# Patient Record
Sex: Female | Born: 1945 | Race: White | Hispanic: No | Marital: Married | Smoking: Never smoker
Health system: Southern US, Community
[De-identification: ages and names within clinical notes are randomized; demographics above are authoritative.]

## PROBLEM LIST (undated history)

## (undated) DIAGNOSIS — I10 Essential (primary) hypertension: Secondary | ICD-10-CM

## (undated) DIAGNOSIS — E7800 Pure hypercholesterolemia, unspecified: Secondary | ICD-10-CM

## (undated) DIAGNOSIS — E079 Disorder of thyroid, unspecified: Secondary | ICD-10-CM

## (undated) HISTORY — PX: CATARACT EXTRACTION: SUR2

## (undated) HISTORY — PX: BREAST EXCISIONAL BIOPSY: SUR124

---

## 2001-05-06 ENCOUNTER — Ambulatory Visit (HOSPITAL_COMMUNITY): Admission: RE | Admit: 2001-05-06 | Discharge: 2001-05-06 | Payer: Self-pay | Admitting: Gastroenterology

## 2001-11-14 ENCOUNTER — Encounter: Admission: RE | Admit: 2001-11-14 | Discharge: 2001-11-14 | Payer: Self-pay | Admitting: General Surgery

## 2001-11-14 ENCOUNTER — Encounter: Payer: Self-pay | Admitting: General Surgery

## 2002-04-22 ENCOUNTER — Encounter: Admission: RE | Admit: 2002-04-22 | Discharge: 2002-04-22 | Payer: Self-pay | Admitting: General Surgery

## 2002-04-22 ENCOUNTER — Encounter: Payer: Self-pay | Admitting: General Surgery

## 2002-11-05 ENCOUNTER — Encounter: Payer: Self-pay | Admitting: Internal Medicine

## 2002-11-05 ENCOUNTER — Encounter: Admission: RE | Admit: 2002-11-05 | Discharge: 2002-11-05 | Payer: Self-pay | Admitting: Internal Medicine

## 2003-04-26 ENCOUNTER — Encounter: Admission: RE | Admit: 2003-04-26 | Discharge: 2003-04-26 | Payer: Self-pay | Admitting: General Surgery

## 2004-05-17 ENCOUNTER — Encounter: Admission: RE | Admit: 2004-05-17 | Discharge: 2004-05-17 | Payer: Self-pay | Admitting: General Surgery

## 2005-08-09 ENCOUNTER — Encounter: Admission: RE | Admit: 2005-08-09 | Discharge: 2005-08-09 | Payer: Self-pay | Admitting: General Surgery

## 2005-10-23 ENCOUNTER — Ambulatory Visit (HOSPITAL_BASED_OUTPATIENT_CLINIC_OR_DEPARTMENT_OTHER): Admission: RE | Admit: 2005-10-23 | Discharge: 2005-10-23 | Payer: Self-pay | Admitting: Orthopedic Surgery

## 2006-09-13 ENCOUNTER — Encounter: Admission: RE | Admit: 2006-09-13 | Discharge: 2006-09-13 | Payer: Self-pay | Admitting: Internal Medicine

## 2007-09-22 ENCOUNTER — Encounter: Admission: RE | Admit: 2007-09-22 | Discharge: 2007-09-22 | Payer: Self-pay | Admitting: Internal Medicine

## 2007-11-29 ENCOUNTER — Emergency Department (HOSPITAL_COMMUNITY): Admission: EM | Admit: 2007-11-29 | Discharge: 2007-11-29 | Payer: Self-pay | Admitting: Emergency Medicine

## 2008-02-20 ENCOUNTER — Ambulatory Visit (HOSPITAL_BASED_OUTPATIENT_CLINIC_OR_DEPARTMENT_OTHER): Admission: RE | Admit: 2008-02-20 | Discharge: 2008-02-20 | Payer: Self-pay | Admitting: Orthopedic Surgery

## 2008-08-17 ENCOUNTER — Encounter: Admission: RE | Admit: 2008-08-17 | Discharge: 2008-08-17 | Payer: Self-pay | Admitting: Internal Medicine

## 2008-11-04 ENCOUNTER — Encounter: Admission: RE | Admit: 2008-11-04 | Discharge: 2008-11-04 | Payer: Self-pay | Admitting: Internal Medicine

## 2008-11-10 ENCOUNTER — Encounter: Admission: RE | Admit: 2008-11-10 | Discharge: 2008-11-10 | Payer: Self-pay | Admitting: Internal Medicine

## 2009-11-11 ENCOUNTER — Encounter: Admission: RE | Admit: 2009-11-11 | Discharge: 2009-11-11 | Payer: Self-pay | Admitting: Internal Medicine

## 2010-04-11 ENCOUNTER — Encounter: Payer: Self-pay | Admitting: Family Medicine

## 2010-05-02 ENCOUNTER — Ambulatory Visit: Payer: Self-pay | Admitting: Sports Medicine

## 2010-05-02 DIAGNOSIS — M7750 Other enthesopathy of unspecified foot: Secondary | ICD-10-CM | POA: Insufficient documentation

## 2010-05-02 DIAGNOSIS — M25569 Pain in unspecified knee: Secondary | ICD-10-CM

## 2010-05-02 DIAGNOSIS — M79609 Pain in unspecified limb: Secondary | ICD-10-CM | POA: Insufficient documentation

## 2010-05-05 ENCOUNTER — Encounter: Payer: Self-pay | Admitting: Family Medicine

## 2010-07-01 ENCOUNTER — Encounter: Payer: Self-pay | Admitting: General Surgery

## 2010-07-03 ENCOUNTER — Encounter: Payer: Self-pay | Admitting: Internal Medicine

## 2010-07-13 NOTE — Letter (Signed)
Summary: *Consult Note  [facility]  [address]   [place], [place] [address]   Phone: [phone]  Fax: [phone]    Re:    Valerie Obrien DOB:    10-Mar-1946   Dear Dr. Thurston Hole:    Thank you for requesting that we see the above patient for consultation.  A copy of the detailed office note will be sent under separate cover, for your review.  Evaluation today is consistent with:  1)  FOOT PAIN, BILATERAL (ICD-729.5) 2)  METATARSALGIA (ICD-726.70) 3)  KNEE PAIN (ICD-719.46)  Our recommendation is for:  [name] was fitted with Sports Insoles with metatarsal pads on both.  These were comfortable to her and she felt improvement in pain while in office.  Recommended good supportive shoes.  Encouraged to start Mobic to help feet and her left knee.  Follow-up with [place] in 3-4 weeks, would be candidate for custom orthotics at that time if sports insoles helpful.  She should follow-up with you as scheduled.   New Orders include:  1)  Consultation Level II [99242] 2)  Foot Orthosis ( Arch Strap/Heel Cup) [L2440] 3)  Sports Insoles [L3510]   New Medications started today include:  1) None  After today's visit, the patients current medications include: 1)  CELEXA 10 MG TABS (CITALOPRAM HYDROBROMIDE) take 1/2 tablet by mouth every other Chalene Treu 2)  SYNTHROID 88 MCG TABS (LEVOTHYROXINE SODIUM) take 1 tablet by mouth once daily 3)  CALTRATE 600+D PLUS 600-400 MG-UNIT TABS (CALCIUM CARBONATE-VIT D-MIN) take 1 tablet by mouth once daily 4)  VOLTAREN 1 % GEL (DICLOFENAC SODIUM) Apply to knee two times a Inetta Dicke prn   Thank you for this consultation.  If you have any further questions regarding the care of this patient, please do not hesitate to contact me @ [phone].  Thank you for this opportunity to look after your patient.  Sincerely,   Darene Lamer DO [facility]

## 2010-07-13 NOTE — Assessment & Plan Note (Signed)
Summary: NP,ORTHOTICS PER [facility]   Vital Signs:  Patient profile:   65 year old female Height:      63 inches Weight:      136 pounds BMI:     24.18 Pulse rate:   76 / minute BP sitting:   125 / 84  (left arm)  Vitals Entered By: Rochele Pages RN (May 02, 2010 3:17 PM) CC: bilat foot pain- ? orthotics   CC:  bilat foot pain- ? orthotics.  History of Present Illness: 65yo female referred to office by Dr. Thurston Hole for possible orthotics evaluation. She has had b/l foot pain for several years, but worse over past several months. Foot pain is primarily on bottom of Rt foot along MT heads.   She has occasional swelling, but denies any erythema or bruising. Denies any injury or trauma.   Has occasional shooting pain radiating into Rt 3rd & 4th toes.  Denies any numbness/tingling. Also has bunions on both feet, so tries to wear athletic shoes with wide toe box.  Athletic shoes are also most comfortable to her. She has increased pain in the foot with walking barefoot at home. Has nevere had orthotics or worn shoe inserts. Prescribed Mobic by Dr. Thurston Hole, but she has not yet filled.  Also c/o L knee pain x 29month.  Had fall when pain started, falling directly onto flexed knee. Since that time having some swelling over anterior aspect of knee. Denies any mechanical symptoms or instability. Prescribed Mobic by Dr. Thurston Hole, but has not yet filled.  Preventive Screening-Counseling & Management  Alcohol-Tobacco     Smoking Status: never  Allergies (verified): 1)  ! Sulfa  Past History:  Past Medical History: Hypothyroid HTN Depression  Past Surgical History: Denies surgical history  Family History: Mom with DM, CAD, HTN  Social History: Retired. Denies tobacco or alcohol useSmoking Status:  never  Review of Systems General:  Denies chills, fatigue, fever, sleep disorder, sweats, and weakness. Eyes:  Denies double vision, eye pain, light sensitivity, and red eye. ENT:   Denies difficulty swallowing, nasal congestion, and sore throat. CV:  Denies chest pain or discomfort, leg cramps with exertion, shortness of breath with exertion, swelling of feet, and swelling of hands. Resp:  Denies chest discomfort, cough, and shortness of breath. GI:  Denies abdominal pain, bloody stools, dark tarry stools, diarrhea, nausea, vomiting, and vomiting blood. MS:  Complains of joint pain and stiffness; denies joint redness, joint swelling, loss of strength, low back pain, mid back pain, muscle aches, muscle, cramps, muscle weakness, and thoracic pain. Derm:  Denies changes in color of skin, changes in nail beds, flushing, itching, lesion(s), and rash. Neuro:  Denies falling down, numbness, poor balance, tingling, tremors, and weakness. Psych:  Denies depression. Endo:  Hx hypothyroid. Heme:  Denies abnormal bruising, bleeding, fevers, and skin discoloration. Allergy:  Denies hives or rash, itching eyes, and sneezing.  Physical Exam  General:  Well-developed,well-nourished,in no acute distress; alert,appropriate and cooperative throughout examination Head:  [place]/AT Eyes:  PERLLA, EOMI Lungs:  normal respiratory effort.   Msk:  KNEES: Full ROM b/l without pain.  Mild amount of soft tissue swelling anteriorly over pre-patellar bursa on left.  TTP over L pre-patellar bursa, otherwise no other knee tenderness.  No ligamentous laxity, neg McMurray bilaterally.  FEET: Mild hallux valgus b/l - L>R.  Longitudinal arches preserved b/l.  Collapse of transverse arches b/l - more prominent on Rt.  TTP along 2nd-4th MT heads on plantar surface of Rt foot,  no associated swelling, bruising, erythema.  Mild pain with MT squeeze.  No significant pain along PF.  Mild pain in 3rd webspace.  Lt foot with mild TTP along MT heads on plantar aspect of foot.  Mild tenderness along PF.  GAIT: Normal heel-to-toe gait.  Lt foot slightly externally rotated with walking.  Pelvis level, no leg length  difference.   Pulses:  +2/4 DP & PT b/l Extremities:  no edema Neurologic:  sensation intact to light touch.     Impression & Recommendations:  Problem # 1:  FOOT PAIN, BILATERAL (ICD-729.5)  - bilateral foot pain secondary to metatarsalgia.  Has significant collapse of transverse arch on Rt with pain along 2nd-4th MT heads. - Fitted with Sports Insoles with MT pads on both sides.  These were comfortable to pt in office & felt some improvement in both foot & knee pain.  Cont to wear these in all of her shoes.  Given additional MT pads to fit in dress shoes that Sports Insoles won't fit into. - Encouraged to wear athletic shoes with good support. - Mild bunion b/l - encouraged to wear shoes with wide toebox for comfort. - Return to clinic in 3-4 weeks, consider custom orthotics at that time.  Orders: Foot Orthosis ( Arch Strap/Heel Cup) [phone]) Sports Insoles [phone])  Problem # 2:  METATARSALGIA (ICD-726.70)  - Bilateral metatarsalgia, most prominent on Rt along 2nd-4th MT heads, but mild on Lt foot. - Fitted with Sports Insoles & MT pads as stated above - Would be candidate for custom orthotics in future if responds well to sports insoles.  Orders: Foot Orthosis ( Arch Strap/Heel Cup) [phone]) Sports Insoles [phone])  Problem # 3:  KNEE PAIN (ICD-719.46)  - L pre-patellar bursitis which is improving - Cont. to follow with Dr. Thurston Hole as scheduled - Recommend she start Mobic as prescribed by Dr. Thurston Hole - Cont. Voltaren gel as prescribed by Dr. Thurston Hole  Orders: Foot Orthosis ( Arch Strap/Heel Cup) [phone]) Sports Insoles [phone])  Complete Medication List: 1)  Celexa 10 Mg Tabs (Citalopram hydrobromide) .... Take 1/2 tablet by mouth every other day 2)  Synthroid 88 Mcg Tabs (Levothyroxine sodium) .... Take 1 tablet by mouth once daily 3)  Caltrate 600+d Plus 600-400 Mg-unit Tabs (Calcium carbonate-vit d-min) .... Take 1 tablet by mouth once daily 4)  Voltaren 1 % Gel (Diclofenac  sodium) .... Apply to knee two times a day prn   Orders Added: 1)  Consultation Level II [99242] 2)  Foot Orthosis ( Arch Strap/Heel Cup) [V7846] 3)  Sports Insoles [L3510]

## 2010-07-13 NOTE — Consult Note (Signed)
Summary: [facility]  [facility]   Imported By: Marily Memos 04/13/2010 09:11:40  _____________________________________________________________________  External Attachment:    Type:   Image     Comment:   External Document

## 2010-08-21 ENCOUNTER — Other Ambulatory Visit: Payer: Self-pay | Admitting: Orthopedic Surgery

## 2010-08-21 ENCOUNTER — Ambulatory Visit
Admission: RE | Admit: 2010-08-21 | Discharge: 2010-08-21 | Disposition: A | Discharge status: Home / Self Care | Payer: BC Managed Care – PPO | Source: Ambulatory Visit | Attending: Orthopedic Surgery | Admitting: Orthopedic Surgery

## 2010-08-21 DIAGNOSIS — M25519 Pain in unspecified shoulder: Secondary | ICD-10-CM

## 2010-10-06 ENCOUNTER — Other Ambulatory Visit: Payer: Self-pay | Admitting: Internal Medicine

## 2010-10-06 DIAGNOSIS — Z1231 Encounter for screening mammogram for malignant neoplasm of breast: Secondary | ICD-10-CM

## 2010-10-10 ENCOUNTER — Other Ambulatory Visit: Payer: Self-pay | Admitting: Orthopedic Surgery

## 2010-10-16 ENCOUNTER — Other Ambulatory Visit: Payer: BC Managed Care – PPO

## 2010-10-24 NOTE — Op Note (Signed)
Valerie Obrien, Valerie Obrien                 ACCOUNT NO.:  [id number]   MEDICAL RECORD NO.:  [id number]          PATIENT TYPE:  AMB   LOCATION:  DSC                          FACILITY:  [facility]   PHYSICIAN:  Katy Fitch. Sypher, M.D. 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   POSTOPERATIVE DIAGNOSIS:  Entrapment neuropathy median nerve right  carpal tunnel.   OPERATIONS:  Release of right transverse carpal ligament.   SURGEON:  Katy Fitch. Sypher, MD   ASSISTANT:  Marveen Reeks Dasnoit, PA-C   ANESTHESIA:  General by LMA.   SUPERVISING ANESTHESIOLOGIST:  Janetta Hora. Gelene Mink, MD   INDICATIONS:  [name] is a 65 year old woman employed by the  [employer] who was referred by Dr. Timothy Lasso for  evaluation and management of right hand numbness.  Clinical examination  revealed signs of probable carpal tunnel syndrome.  Electrodiagnostic  studies confirmed significant right carpal tunnel syndrome.   Due to a failure to respond to nonoperative measures, she is brought to  the operating room at this time for release of right transverse carpal  ligament.   PROCEDURE:  Vastie Douty. Affinito is brought to the operating room and placed  in supine position upon the operating table.   Following the induction of general anesthesia by LMA technique, the  right arm was prepped with Betadine soap and solution and sterilely  draped.  A pneumatic tourniquet was applied to the proximal right  brachium.   On exsanguination of the right arm with an Esmarch bandage, the arterial  tourniquet was inflated to 220 mmHg.  Procedure commenced with a short  incision in the line of the ring finger and the palm.  Subcutaneous  tissues were carefully divided revealing the palmar fascia.  This was  split in line of its fibers to reveal the  superficial palmar arch, ulnar  artery and the distal margin of the transverse carpal ligament.   The carpal canal was sounded with a Insurance risk surveyor.  After careful  protection of the soft tissue structures, the transverse carpal ligament  was released along its ulnar border extending into the distal forearm.  This widely opened the carpal canal.  No mass or predicaments were  noted.   Bleeding points along the margin of the released ligament were  electrocauterized with bipolar current followed by repair of the skin  with intradermal 3-0 Prolene suture.   A compressive dressing was applied with a volar plaster splint  maintaining the wrist in 5 degrees of dorsiflexion.   For aftercare, Ms. Killilea is provided prescription for Tylenol with  Codeine #3 one p.o. q. 6 hours p.r.n. pain 20 tablets without refill.   She will return to see me in the office for followup in 1 week or sooner  if any problems.      Katy Fitch Sypher, M.D.  Electronically Signed     RVS/MEDQ  D:  02/20/2008  T:  02/20/2008  Job:  [id number]   cc:   Gwen Pounds, MD

## 2010-10-27 NOTE — Op Note (Signed)
Valerie Obrien, Valerie Obrien                 ACCOUNT NO.:  [id number]   MEDICAL RECORD NO.:  [id number]          PATIENT TYPE:  AMB   LOCATION:  DSC                          FACILITY:  [facility]   PHYSICIAN:  Katy Fitch. Sypher, M.D. DATE OF BIRTH:  Sep 17, 1945   DATE OF PROCEDURE:  10/23/2005  DATE OF DISCHARGE:                                 OPERATIVE REPORT   PREOP DIAGNOSIS:  Retained foreign body, right ring finger pulp.   POSTOP DIAGNOSIS:  Ziebarth foreign body (7 mm Gloeckner splinter) identified and  removed.   OPERATION:  Exploration of right ring finger pulp and removal of deep  foreign body.   OPERATING SURGEON:  Molly Maduro Sypher   ASSISTANT:  Molly Maduro Dasnoit PA-C.   ANESTHESIA:  2% lidocaine metacarpal head level block, right ring finger.  No sedation was provided.  This was a minor operating room case.   ANESTHETIST:  Dr. Teressa Senter.   INDICATIONS:  Valerie Obrien is a 65 year old woman referred for evaluation of  a chronic right ring finger pulp mass.   She reported experiencing a splinter type injury more than 3 months prior.  She thought she recovered part of the splinter but had a persistent painful  focus in her palm and a chronic sinus tract noted.   She presented for hand surgery consult.   She is advised to consider exploration of her finger under local anesthesia  without sedation.   She is brought to the minor operating room at this time.   PROCEDURE:  Valerie Obrien was brought to the operating room and placed in  supine position on operating table.   Following routine Betadine prep of the hand and finger, 2% lidocaine  metacarpal head level block was placed without complication.   When anesthesia was satisfactory, the right hand and arm were prepped with  Betadine soap solution, sterilely draped.  The right ring finger was  exsanguinated with a gauze wrap and a digital tourniquet placed with quarter  inch Penrose drain.   The procedure commenced with an elliptical excision of  the sinus tract..  Subcutaneous tissues were carefully divided revealing a fibrotic sinus tract  that extended down to the flexor tendon region.   This was circumferentially dissected followed by identification of Rollison  foreign body.  The foreign body was removed and shown to [name] who was  fully awake and alert.   The wound was then inspected for other masses, none were identified.   The wound was repaired with mattress suture of 5-0 nylon x2.   There no apparent complications.   The finger was then dressed with Xeroflo sterile gauze and a Coban dressing.   For aftercare [name] is advised elevator hand for 2 days.   She will keep her dressing dry between now and 10/27/05.   On that date she will remove her dressing and apply Band-Aids.   We will see her back for follow up on 10/31/2005.   She will use over-the-counter analgesics.   She has not been provided prophylactic antibiotics.      Katy Fitch Sypher, M.D.  Electronically Signed     RVS/MEDQ  D:  10/23/2005  T:  10/23/2005  Job:  [id number]

## 2010-11-14 ENCOUNTER — Ambulatory Visit: Payer: BC Managed Care – PPO

## 2010-11-15 ENCOUNTER — Ambulatory Visit
Admission: RE | Admit: 2010-11-15 | Discharge: 2010-11-15 | Disposition: A | Discharge status: Home / Self Care | Payer: BC Managed Care – PPO | Source: Ambulatory Visit | Attending: Internal Medicine | Admitting: Internal Medicine

## 2010-11-15 DIAGNOSIS — Z1231 Encounter for screening mammogram for malignant neoplasm of breast: Secondary | ICD-10-CM

## 2011-03-14 LAB — POCT HEMOGLOBIN-HEMACUE: Hemoglobin: 16.4 — ABNORMAL HIGH

## 2011-11-07 ENCOUNTER — Other Ambulatory Visit: Payer: Self-pay | Admitting: Internal Medicine

## 2011-11-07 DIAGNOSIS — Z1231 Encounter for screening mammogram for malignant neoplasm of breast: Secondary | ICD-10-CM

## 2011-11-26 ENCOUNTER — Ambulatory Visit
Admission: RE | Admit: 2011-11-26 | Discharge: 2011-11-26 | Disposition: A | Discharge status: Home / Self Care | Payer: BC Managed Care – PPO | Source: Ambulatory Visit | Attending: Internal Medicine | Admitting: Internal Medicine

## 2011-11-26 DIAGNOSIS — Z1231 Encounter for screening mammogram for malignant neoplasm of breast: Secondary | ICD-10-CM

## 2012-10-20 ENCOUNTER — Other Ambulatory Visit: Payer: Self-pay

## 2012-10-20 DIAGNOSIS — Z1231 Encounter for screening mammogram for malignant neoplasm of breast: Secondary | ICD-10-CM

## 2012-11-26 ENCOUNTER — Ambulatory Visit: Payer: BC Managed Care – PPO

## 2012-12-03 ENCOUNTER — Ambulatory Visit
Admission: RE | Admit: 2012-12-03 | Discharge: 2012-12-03 | Disposition: A | Discharge status: Home / Self Care | Payer: BC Managed Care – PPO | Source: Ambulatory Visit

## 2012-12-03 DIAGNOSIS — Z1231 Encounter for screening mammogram for malignant neoplasm of breast: Secondary | ICD-10-CM

## 2013-09-01 ENCOUNTER — Ambulatory Visit: Payer: BC Managed Care – PPO | Admitting: Physical Therapy

## 2013-09-02 ENCOUNTER — Ambulatory Visit: Payer: Medicare Other | Attending: Orthopedic Surgery | Admitting: Physical Therapy

## 2013-09-02 DIAGNOSIS — M7750 Other enthesopathy of unspecified foot: Secondary | ICD-10-CM | POA: Insufficient documentation

## 2013-09-02 DIAGNOSIS — M25569 Pain in unspecified knee: Secondary | ICD-10-CM | POA: Insufficient documentation

## 2013-09-02 DIAGNOSIS — M79609 Pain in unspecified limb: Secondary | ICD-10-CM | POA: Insufficient documentation

## 2013-09-02 DIAGNOSIS — IMO0001 Reserved for inherently not codable concepts without codable children: Secondary | ICD-10-CM | POA: Insufficient documentation

## 2013-09-07 ENCOUNTER — Ambulatory Visit: Payer: Medicare Other | Admitting: Physical Therapy

## 2013-09-09 ENCOUNTER — Ambulatory Visit: Payer: Medicare Other | Attending: Orthopedic Surgery | Admitting: Physical Therapy

## 2013-09-09 DIAGNOSIS — R5381 Other malaise: Secondary | ICD-10-CM | POA: Insufficient documentation

## 2013-09-09 DIAGNOSIS — M25579 Pain in unspecified ankle and joints of unspecified foot: Secondary | ICD-10-CM | POA: Insufficient documentation

## 2013-09-09 DIAGNOSIS — Z5189 Encounter for other specified aftercare: Secondary | ICD-10-CM | POA: Insufficient documentation

## 2013-09-14 ENCOUNTER — Ambulatory Visit: Payer: Medicare Other | Admitting: *Deleted

## 2013-09-16 ENCOUNTER — Encounter: Payer: BC Managed Care – PPO | Admitting: Physical Therapy

## 2013-09-21 ENCOUNTER — Ambulatory Visit: Payer: Medicare Other | Admitting: Physical Therapy

## 2013-09-28 ENCOUNTER — Encounter: Payer: BC Managed Care – PPO | Admitting: Physical Therapy

## 2013-10-26 ENCOUNTER — Other Ambulatory Visit: Payer: Self-pay

## 2013-10-26 DIAGNOSIS — Z1231 Encounter for screening mammogram for malignant neoplasm of breast: Secondary | ICD-10-CM

## 2013-12-04 ENCOUNTER — Other Ambulatory Visit: Payer: Self-pay

## 2013-12-04 ENCOUNTER — Ambulatory Visit
Admission: RE | Admit: 2013-12-04 | Discharge: 2013-12-04 | Disposition: A | Discharge status: Home / Self Care | Payer: Medicare Other | Source: Ambulatory Visit

## 2013-12-04 ENCOUNTER — Encounter (INDEPENDENT_AMBULATORY_CARE_PROVIDER_SITE_OTHER): Payer: Self-pay

## 2013-12-04 DIAGNOSIS — Z1231 Encounter for screening mammogram for malignant neoplasm of breast: Secondary | ICD-10-CM

## 2014-11-15 ENCOUNTER — Other Ambulatory Visit: Payer: Self-pay

## 2014-11-15 DIAGNOSIS — Z1231 Encounter for screening mammogram for malignant neoplasm of breast: Secondary | ICD-10-CM

## 2014-11-16 ENCOUNTER — Ambulatory Visit: Payer: Self-pay | Admitting: Podiatry

## 2014-11-19 ENCOUNTER — Ambulatory Visit (INDEPENDENT_AMBULATORY_CARE_PROVIDER_SITE_OTHER): Payer: Medicare Other | Admitting: Podiatry

## 2014-11-19 ENCOUNTER — Encounter: Payer: Self-pay | Admitting: Podiatry

## 2014-11-19 ENCOUNTER — Ambulatory Visit (INDEPENDENT_AMBULATORY_CARE_PROVIDER_SITE_OTHER): Payer: Medicare Other

## 2014-11-19 VITALS — BP 147/86 | HR 55 | Resp 16

## 2014-11-19 DIAGNOSIS — M2040 Other hammer toe(s) (acquired), unspecified foot: Secondary | ICD-10-CM | POA: Diagnosis not present

## 2014-11-19 DIAGNOSIS — M2010 Hallux valgus (acquired), unspecified foot: Secondary | ICD-10-CM | POA: Diagnosis not present

## 2014-11-19 NOTE — Progress Notes (Signed)
° °  Subjective:    Patient ID: Valerie Obrien, female    DOB: May 18, 1946, 69 y.o.   MRN: [id number]  HPI   N: Nothing L; B/L foot D: Fungus over a year, Hammer toe for a while (if that's what it is) O: Sudden C: nothing A: Nothing T: Keep clipped    Review of Systems  All other systems reviewed and are negative.      Objective:   Physical Exam        Assessment & Plan:

## 2014-11-22 NOTE — Progress Notes (Signed)
Subjective:     Patient ID: Valerie Obrien, female   DOB: December 02, 1945, 69 y.o.   MRN: [id number]  HPI patient presents concerned because she's getting structural changes around her first metatarsal of both feet and that she has digital deformities of the second toes both feet with lifting of the toes and pain when pressed. She states that they're not significantly sore but she was just concerned that they could become more of a problem later on   Review of Systems  All other systems reviewed and are negative.      Objective:   Physical Exam  Constitutional: She is oriented to person, place, and time.  Cardiovascular: Intact distal pulses.   Musculoskeletal: Normal range of motion.  Neurological: She is oriented to person, place, and time.  Skin: Skin is warm and dry.  Nursing note and vitals reviewed.  neurovascular status is intact with muscle strength adequate and range of motion subtalar midtarsal joint within normal limits. Patient's noted to have hyperostosis medial aspect first metatarsal head bilateral with redness around the surfaces but no discomfort when pressed and mild elevation the second toe bilateral. 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 At this point I have recommended she continue nonsurgical and utilize wider-type shoes with soft leather and if they ever do increase in symptoms ultimately they may need to be fixed

## 2014-12-10 ENCOUNTER — Ambulatory Visit: Payer: Self-pay

## 2014-12-27 ENCOUNTER — Ambulatory Visit
Admission: RE | Admit: 2014-12-27 | Discharge: 2014-12-27 | Disposition: A | Discharge status: Home / Self Care | Payer: Medicare Other | Source: Ambulatory Visit

## 2014-12-27 DIAGNOSIS — Z1231 Encounter for screening mammogram for malignant neoplasm of breast: Secondary | ICD-10-CM

## 2015-02-16 ENCOUNTER — Telehealth: Payer: Self-pay | Admitting: *Deleted

## 2015-02-16 ENCOUNTER — Telehealth: Payer: Self-pay | Admitting: Podiatry

## 2015-02-16 NOTE — Telephone Encounter (Signed)
I encouraged pt to make an appt, since she said it was very painful in the corner and had calloused skin at the tip, I told pt to use Epsom salt soaks and antibiotic ointment until seen and transferred pt to schedulers.

## 2015-02-16 NOTE — Telephone Encounter (Signed)
Entered in error

## 2015-02-16 NOTE — Telephone Encounter (Signed)
Pt called in stating she cut her toenail too short , she doesn't have any bleeding , no puss , but the next day it was very sore and red. She said she soaked it in salt water and has been putting peroxide on it and also using Red Oil on it also. She is not a diabetic. She also says when she puts a band aid on it its gives her discomfort and it irrates her toe. Wanting too know what she should do about this. Should she keep doing what she is doing or should she come in , che says she doesn't want it too get infected. If you could give her a call back or inform me on what too instruct her and i was give her a call with the information .     Thank You !!  Fredrich Birks

## 2015-02-17 ENCOUNTER — Encounter: Payer: Self-pay | Admitting: Podiatry

## 2015-02-17 ENCOUNTER — Ambulatory Visit (INDEPENDENT_AMBULATORY_CARE_PROVIDER_SITE_OTHER): Payer: Medicare Other | Admitting: Podiatry

## 2015-02-17 VITALS — BP 128/77 | HR 74 | Resp 16

## 2015-02-17 DIAGNOSIS — L600 Ingrowing nail: Secondary | ICD-10-CM

## 2015-02-17 NOTE — Patient Instructions (Signed)

## 2015-02-18 ENCOUNTER — Telehealth: Payer: Self-pay | Admitting: *Deleted

## 2015-02-18 NOTE — Progress Notes (Signed)
Subjective:     Patient ID: Valerie Obrien, female   DOB: September 18, 1945, 69 y.o.   MRN: [id number]  HPI patient presents with painful ingrown toenails of the second nail E deal border bilateral and admits she tries to trim them herself and at times she can do the corners   Review of Systems     Objective:   Physical Exam Neurovascular status found to be intact muscle strength adequate with incurvated nailbed second bilateral medial border that are painful when pressed with no indications of current infection or drainage    Assessment:     Ingrown toenail deformity second bilateral    Plan:     H&P and condition reviewed with patient. I've recommended removing the corners and I explained risk of procedure and she wants this performed area at this time I infiltrated each second toe with 60 mg Xylocaine Marcaine mixture removed the medial corners exposed matrix and applied phenol 3 applications 30 seconds followed by alcohol lavage and sterile dressing. Gave instructions on soaks and reappoint

## 2015-02-18 NOTE — Telephone Encounter (Signed)
Called patient at [phone] (Cell #) to check to see how they were feeling from their ingrown toenail procedure that was performed on Thursday, February 17, 2015. Pt stated, "Feel okay, but had some pain last night between 10 and 10:30 pm". Pt took Advil with some relief. Pt also stated, "there was bruising where the injection went into".

## 2015-12-19 ENCOUNTER — Other Ambulatory Visit: Payer: Self-pay | Admitting: Internal Medicine

## 2015-12-19 DIAGNOSIS — Z1231 Encounter for screening mammogram for malignant neoplasm of breast: Secondary | ICD-10-CM

## 2015-12-30 ENCOUNTER — Ambulatory Visit
Admission: RE | Admit: 2015-12-30 | Discharge: 2015-12-30 | Disposition: A | Discharge status: Home / Self Care | Payer: Medicare Other | Source: Ambulatory Visit | Attending: Internal Medicine | Admitting: Internal Medicine

## 2015-12-30 DIAGNOSIS — Z1231 Encounter for screening mammogram for malignant neoplasm of breast: Secondary | ICD-10-CM

## 2016-01-03 ENCOUNTER — Telehealth: Payer: Self-pay | Admitting: Cardiology

## 2016-01-03 NOTE — Telephone Encounter (Signed)
Records received from [facility] for appointment on 04/30/16 with Dr Martinique.  Records given to [facility] (medical records) for Dr Doug Sou schedule on 04/30/16. lp

## 2016-04-30 ENCOUNTER — Ambulatory Visit: Payer: Medicare Other | Admitting: Cardiology

## 2016-06-09 NOTE — Progress Notes (Deleted)
Cardiology Office Note    Date:  06/09/2016   ID:  Valerie Obrien, Valerie Obrien 1945-08-23, MRN [id number]  PCP:  Precious Reel, MD  Cardiologist:  Deshane Cotroneo Martinique, MD    History of Present Illness:  Valerie Obrien is a 70 y.o. female seen at the request of Dr. Virgina Jock for evaluation of palpitations.    No past medical history on file.  No past surgical history on file.  Current Medications: Outpatient Medications Prior to Visit  Medication Sig Dispense Refill  . Calcium Carbonate-Vitamin D (CALTRATE 600+D PO) Take 2 tablets by mouth daily.    . citalopram (CELEXA) 20 MG tablet daily. as directed  5  . diclofenac sodium (VOLTAREN) 1 % GEL APPLY 2-4 GRAM TO AFFECTED AREA 4 TIMES DAILY AS NEEDED  1  . DIOVAN 80 MG tablet TAKE 1 TABLET EVERY DAY AS DIRECTED  5  . omeprazole (PRILOSEC) 10 MG capsule Take 10 mg by mouth daily.    Marland Kitchen SYNTHROID 88 MCG tablet Take 88 mcg by mouth daily.  9   No facility-administered medications prior to visit.      Allergies:   Sulfonamide derivatives   Social History   Social History  . Marital status: Married    Spouse name: N/A  . Number of children: N/A  . Years of education: N/A   Social History Main Topics  . Smoking status: Never Smoker  . Smokeless tobacco: Not on file  . Alcohol use Not on file  . Drug use: Unknown  . Sexual activity: Not on file   Other Topics Concern  . Not on file   Social History Narrative  . No narrative on file     Family History:  The patient's ***family history is not on file.   ROS:   Please see the history of present illness.    ROS All other systems reviewed and are negative.   PHYSICAL EXAM:   VS:  There were no vitals taken for this visit.   GEN: Well nourished, well developed, in no acute distress  HEENT: normal  Neck: no JVD, carotid bruits, or masses Cardiac: ***RRR; no murmurs, rubs, or gallops,no edema  Respiratory:  clear to auscultation bilaterally, normal work of breathing GI: soft,  nontender, nondistended, + BS MS: no deformity or atrophy  Skin: warm and dry, no rash Neuro:  Alert and Oriented x 3, Strength and sensation are intact Psych: euthymic mood, full affect  Wt Readings from Last 3 Encounters:  05/02/10 136 lb (61.7 kg)      Studies/Labs Reviewed:   EKG:  EKG is*** ordered today.  The ekg ordered today demonstrates ***  Recent Labs: No results found for requested labs within last 8760 hours.   Lipid Panel No results found for: CHOL, TRIG, HDL, CHOLHDL, VLDL, LDLCALC, LDLDIRECT  Additional studies/ records that were reviewed today include:  Labs dated 11/08/15: cholesterol 174, triglycerides 116, LDL 203, HDL 48. CBC normal Dated 05/11/16: A1c 5.9%. CMET and TSH normal.   ASSESSMENT:    No diagnosis found.   PLAN:  In order of problems listed above:  1. ***    Medication Adjustments/Labs and Tests Ordered: Current medicines are reviewed at length with the patient today.  Concerns regarding medicines are outlined above.  Medication changes, Labs and Tests ordered today are listed in the Patient Instructions below. There are no Patient Instructions on file for this visit.   Signed, Garen Woolbright Martinique, MD  06/09/2016 8:55 AM  [facility] [address], [place], [place], [address] [phone]

## 2016-06-13 ENCOUNTER — Ambulatory Visit: Payer: Medicare Other | Admitting: Cardiology

## 2017-01-24 ENCOUNTER — Other Ambulatory Visit: Payer: Self-pay | Admitting: Internal Medicine

## 2017-01-24 DIAGNOSIS — Z1231 Encounter for screening mammogram for malignant neoplasm of breast: Secondary | ICD-10-CM

## 2017-02-07 ENCOUNTER — Ambulatory Visit
Admission: RE | Admit: 2017-02-07 | Discharge: 2017-02-07 | Disposition: A | Discharge status: Home / Self Care | Payer: Medicare Other | Source: Ambulatory Visit | Attending: Internal Medicine | Admitting: Internal Medicine

## 2017-02-07 ENCOUNTER — Encounter: Payer: Self-pay | Admitting: Radiology

## 2017-02-07 DIAGNOSIS — Z1231 Encounter for screening mammogram for malignant neoplasm of breast: Secondary | ICD-10-CM

## 2017-12-16 ENCOUNTER — Ambulatory Visit (HOSPITAL_COMMUNITY)
Admission: RE | Admit: 2017-12-16 | Discharge: 2017-12-16 | Disposition: A | Discharge status: Home / Self Care | Payer: Medicare Other | Source: Ambulatory Visit | Attending: Orthopedic Surgery | Admitting: Orthopedic Surgery

## 2017-12-16 ENCOUNTER — Other Ambulatory Visit (HOSPITAL_COMMUNITY): Payer: Self-pay | Admitting: Orthopedic Surgery

## 2017-12-16 DIAGNOSIS — M79605 Pain in left leg: Secondary | ICD-10-CM

## 2017-12-16 DIAGNOSIS — R936 Abnormal findings on diagnostic imaging of limbs: Secondary | ICD-10-CM | POA: Insufficient documentation

## 2017-12-16 DIAGNOSIS — M7989 Other specified soft tissue disorders: Secondary | ICD-10-CM | POA: Diagnosis not present

## 2017-12-16 NOTE — Progress Notes (Signed)
*  PRELIMINARY RESULTS* Vascular Ultrasound Left lower extremity venous duplex has been completed.  Preliminary findings: No evidence of deep vein thrombosis in the left lower extremity.  Small fluid collection seen posterior knee.  Preliminary results called to Dr. Elsie Saas @ 16:30.   Everrett Coombe 12/16/2017, 4:50 PM

## 2018-01-17 ENCOUNTER — Other Ambulatory Visit: Payer: Self-pay | Admitting: Internal Medicine

## 2018-01-17 DIAGNOSIS — Z1231 Encounter for screening mammogram for malignant neoplasm of breast: Secondary | ICD-10-CM

## 2018-02-11 ENCOUNTER — Ambulatory Visit
Admission: RE | Admit: 2018-02-11 | Discharge: 2018-02-11 | Disposition: A | Discharge status: Home / Self Care | Payer: Medicare Other | Source: Ambulatory Visit | Attending: Internal Medicine | Admitting: Internal Medicine

## 2018-02-11 DIAGNOSIS — Z1231 Encounter for screening mammogram for malignant neoplasm of breast: Secondary | ICD-10-CM

## 2018-10-01 ENCOUNTER — Other Ambulatory Visit: Payer: Self-pay

## 2018-10-01 ENCOUNTER — Observation Stay (HOSPITAL_COMMUNITY)
Admission: EM | Admit: 2018-10-01 | Discharge: 2018-10-03 | Disposition: A | Discharge status: Home / Self Care | Payer: Medicare Other | Attending: Internal Medicine | Admitting: Internal Medicine

## 2018-10-01 ENCOUNTER — Emergency Department (HOSPITAL_COMMUNITY): Payer: Medicare Other

## 2018-10-01 ENCOUNTER — Encounter (HOSPITAL_COMMUNITY): Payer: Self-pay | Admitting: Emergency Medicine

## 2018-10-01 DIAGNOSIS — R109 Unspecified abdominal pain: Secondary | ICD-10-CM

## 2018-10-01 DIAGNOSIS — Z880 Allergy status to penicillin: Secondary | ICD-10-CM | POA: Diagnosis not present

## 2018-10-01 DIAGNOSIS — E7800 Pure hypercholesterolemia, unspecified: Secondary | ICD-10-CM | POA: Insufficient documentation

## 2018-10-01 DIAGNOSIS — R7989 Other specified abnormal findings of blood chemistry: Secondary | ICD-10-CM | POA: Insufficient documentation

## 2018-10-01 DIAGNOSIS — E876 Hypokalemia: Secondary | ICD-10-CM | POA: Insufficient documentation

## 2018-10-01 DIAGNOSIS — Z79899 Other long term (current) drug therapy: Secondary | ICD-10-CM | POA: Insufficient documentation

## 2018-10-01 DIAGNOSIS — E785 Hyperlipidemia, unspecified: Secondary | ICD-10-CM | POA: Diagnosis not present

## 2018-10-01 DIAGNOSIS — E039 Hypothyroidism, unspecified: Secondary | ICD-10-CM | POA: Diagnosis not present

## 2018-10-01 DIAGNOSIS — K810 Acute cholecystitis: Secondary | ICD-10-CM

## 2018-10-01 DIAGNOSIS — K819 Cholecystitis, unspecified: Secondary | ICD-10-CM | POA: Diagnosis present

## 2018-10-01 DIAGNOSIS — I160 Hypertensive urgency: Secondary | ICD-10-CM | POA: Insufficient documentation

## 2018-10-01 DIAGNOSIS — Z79890 Hormone replacement therapy (postmenopausal): Secondary | ICD-10-CM | POA: Insufficient documentation

## 2018-10-01 DIAGNOSIS — F329 Major depressive disorder, single episode, unspecified: Secondary | ICD-10-CM | POA: Insufficient documentation

## 2018-10-01 DIAGNOSIS — K8010 Calculus of gallbladder with chronic cholecystitis without obstruction: Secondary | ICD-10-CM | POA: Diagnosis not present

## 2018-10-01 DIAGNOSIS — Z882 Allergy status to sulfonamides status: Secondary | ICD-10-CM | POA: Diagnosis not present

## 2018-10-01 DIAGNOSIS — R21 Rash and other nonspecific skin eruption: Secondary | ICD-10-CM | POA: Insufficient documentation

## 2018-10-01 DIAGNOSIS — Z419 Encounter for procedure for purposes other than remedying health state, unspecified: Secondary | ICD-10-CM

## 2018-10-01 HISTORY — DX: Pure hypercholesterolemia, unspecified: E78.00

## 2018-10-01 HISTORY — DX: Essential (primary) hypertension: I10

## 2018-10-01 HISTORY — DX: Disorder of thyroid, unspecified: E07.9

## 2018-10-01 LAB — CBC WITH DIFFERENTIAL/PLATELET
Abs Immature Granulocytes: 0.02 10*3/uL (ref 0.00–0.07)
Basophils Absolute: 0.1 10*3/uL (ref 0.0–0.1)
Basophils Relative: 1 %
Eosinophils Absolute: 0 10*3/uL (ref 0.0–0.5)
Eosinophils Relative: 1 %
HCT: 46.8 % — ABNORMAL HIGH (ref 36.0–46.0)
Hemoglobin: 15.7 g/dL — ABNORMAL HIGH (ref 12.0–15.0)
Immature Granulocytes: 0 %
Lymphocytes Relative: 17 %
Lymphs Abs: 1.3 10*3/uL (ref 0.7–4.0)
MCH: 28.5 pg (ref 26.0–34.0)
MCHC: 33.5 g/dL (ref 30.0–36.0)
MCV: 84.9 fL (ref 80.0–100.0)
Monocytes Absolute: 0.7 10*3/uL (ref 0.1–1.0)
Monocytes Relative: 8 %
Neutro Abs: 5.8 10*3/uL (ref 1.7–7.7)
Neutrophils Relative %: 73 %
Platelets: 131 10*3/uL — ABNORMAL LOW (ref 150–400)
RBC: 5.51 MIL/uL — ABNORMAL HIGH (ref 3.87–5.11)
RDW: 13.6 % (ref 11.5–15.5)
WBC: 7.9 10*3/uL (ref 4.0–10.5)
nRBC: 0 % (ref 0.0–0.2)

## 2018-10-01 LAB — URINALYSIS, ROUTINE W REFLEX MICROSCOPIC
Bilirubin Urine: NEGATIVE
Glucose, UA: NEGATIVE mg/dL
Hgb urine dipstick: NEGATIVE
Ketones, ur: 20 mg/dL — AB
Leukocytes,Ua: NEGATIVE
Nitrite: NEGATIVE
Protein, ur: NEGATIVE mg/dL
Specific Gravity, Urine: 1.015 (ref 1.005–1.030)
pH: 8 (ref 5.0–8.0)

## 2018-10-01 LAB — COMPREHENSIVE METABOLIC PANEL
ALT: 252 U/L — ABNORMAL HIGH (ref 0–44)
AST: 448 U/L — ABNORMAL HIGH (ref 15–41)
Albumin: 4.1 g/dL (ref 3.5–5.0)
Alkaline Phosphatase: 133 U/L — ABNORMAL HIGH (ref 38–126)
Anion gap: 13 (ref 5–15)
BUN: 14 mg/dL (ref 8–23)
CO2: 22 mmol/L (ref 22–32)
Calcium: 9.8 mg/dL (ref 8.9–10.3)
Chloride: 104 mmol/L (ref 98–111)
Creatinine, Ser: 0.95 mg/dL (ref 0.44–1.00)
GFR calc Af Amer: >60 mL/min (ref >60)
GFR calc non Af Amer: >60 mL/min (ref >60)
Glucose, Bld: 115 mg/dL — ABNORMAL HIGH (ref 70–99)
Potassium: 4.2 mmol/L (ref 3.5–5.1)
Sodium: 139 mmol/L (ref 135–145)
Total Bilirubin: 1.8 mg/dL — ABNORMAL HIGH (ref 0.3–1.2)
Total Protein: 7 g/dL (ref 6.5–8.1)

## 2018-10-01 LAB — LIPASE, BLOOD: Lipase: 34 U/L (ref 11–51)

## 2018-10-01 MED ORDER — FENTANYL CITRATE (PF) 100 MCG/2ML IJ SOLN: 50.0000 ug | Freq: Once | INTRAMUSCULAR | Status: DC

## 2018-10-01 MED ORDER — LEVOTHYROXINE SODIUM 100 MCG/5ML IV SOLN
40.0000 ug | Freq: Every day | INTRAVENOUS | Status: DC
  Filled 2018-10-01: qty 5

## 2018-10-01 MED ORDER — DEXTROSE-NACL 5-0.9 % IV SOLN
INTRAVENOUS | Status: DC
  Administered 2018-10-02: via INTRAVENOUS

## 2018-10-01 MED ORDER — ONDANSETRON HCL 4 MG/2ML IJ SOLN: 4.0000 mg | Freq: Once | INTRAMUSCULAR | Status: DC

## 2018-10-01 MED ORDER — ONDANSETRON HCL 4 MG/2ML IJ SOLN: 4.0000 mg | Freq: Four times a day (QID) | INTRAMUSCULAR | Status: DC | PRN

## 2018-10-01 MED ORDER — MORPHINE SULFATE (PF) 2 MG/ML IV SOLN: 1.0000 mg | INTRAVENOUS | Status: DC | PRN

## 2018-10-01 MED ORDER — METOPROLOL TARTRATE 5 MG/5ML IV SOLN
5.0000 mg | Freq: Once | INTRAVENOUS | Status: AC
  Administered 2018-10-01: 22:00:00 5 mg via INTRAVENOUS
  Filled 2018-10-01: qty 5

## 2018-10-01 MED ORDER — HYDRALAZINE HCL 20 MG/ML IJ SOLN
5.0000 mg | INTRAMUSCULAR | Status: DC | PRN
  Administered 2018-10-02 (×2): 5 mg via INTRAVENOUS
  Filled 2018-10-01 (×2): qty 1

## 2018-10-01 MED ORDER — PIPERACILLIN-TAZOBACTAM 4.5 G IVPB: 4.5000 g | Freq: Once | INTRAVENOUS | Status: DC

## 2018-10-01 MED ORDER — ACETAMINOPHEN 325 MG PO TABS
650.0000 mg | ORAL_TABLET | Freq: Four times a day (QID) | ORAL | Status: DC | PRN
  Administered 2018-10-02 – 2018-10-03 (×4): 650 mg via ORAL
  Filled 2018-10-01 (×4): qty 2

## 2018-10-01 MED ORDER — ONDANSETRON HCL 4 MG PO TABS: 4.0000 mg | ORAL_TABLET | Freq: Four times a day (QID) | ORAL | Status: DC | PRN

## 2018-10-01 MED ORDER — PIPERACILLIN-TAZOBACTAM 3.375 G IVPB
3.3750 g | Freq: Three times a day (TID) | INTRAVENOUS | Status: DC
  Administered 2018-10-01 – 2018-10-02 (×2): 3.375 g via INTRAVENOUS
  Filled 2018-10-01 (×2): qty 50

## 2018-10-01 MED ORDER — ACETAMINOPHEN 650 MG RE SUPP: 650.0000 mg | Freq: Four times a day (QID) | RECTAL | Status: DC | PRN

## 2018-10-01 MED ORDER — ONDANSETRON 4 MG PO TBDP
4.0000 mg | ORAL_TABLET | Freq: Once | ORAL | Status: AC
  Administered 2018-10-01: 19:00:00 4 mg via ORAL
  Filled 2018-10-01: qty 1

## 2018-10-01 NOTE — ED Notes (Signed)
Patient transported to [facility] 

## 2018-10-01 NOTE — ED Notes (Signed)
Pt given ginger ale.

## 2018-10-01 NOTE — Consult Note (Addendum)
CC: Consult for possible choledocholithiasis, cholecystitis  HPI: Valerie Obrien is an 73 y.o. female with hx of HTN (currently poorly controlled), HLD, hypothyroidism whom presented to her PCP today with RUQ/MEG pain that began at 11am. Associated n/v. She was examiend in her car and had blood work done. She was told her liver numbers were up and to go to the ED. She denies ever having had this kind of pain before. Pain does not radiate. Nothing makes it better/worse. She denies changes in bowel habits. +Chills; denies fevers.  PSH: Only prior abdominal surgery she reports is a laparoscopic hysterectomy for benign reasons  WBC normal TBili 1.8; AST/ALT/Alk Phos 448 / 252 / 133 Lipase 34  [place] RUQ shows gallstones; GBW 8 mm. CBD 85m  Past Medical History:  Diagnosis Date  . High cholesterol   . Hypertension   . Thyroid disease    hypo    Past Surgical History:  Procedure Laterality Date  . BREAST EXCISIONAL BIOPSY Left   . CATARACT EXTRACTION     left eye    History reviewed. No pertinent family history.  Social:  reports that she has never smoked. She has never used smokeless tobacco. She reports previous alcohol use. She reports that she does not use drugs.  Allergies:  Allergies  Allergen Reactions  . Sulfonamide Derivatives     Medications: I have reviewed the patient's current medications.  Results for orders placed or performed during the hospital encounter of 10/01/18 (from the past 48 hour(s))  Comprehensive metabolic panel     Status: Abnormal   Collection Time: 10/01/18  5:42 PM  Result Value Ref Range   Sodium 139 135 - 145 mmol/L   Potassium 4.2 3.5 - 5.1 mmol/L    Comment: SLIGHT HEMOLYSIS   Chloride 104 98 - 111 mmol/L   CO2 22 22 - 32 mmol/L   Glucose, Bld 115 (H) 70 - 99 mg/dL   BUN 14 8 - 23 mg/dL   Creatinine, Ser 0.95 0.44 - 1.00 mg/dL   Calcium 9.8 8.9 - 10.3 mg/dL   Total Protein 7.0 6.5 - 8.1 g/dL   Albumin 4.1 3.5 - 5.0 g/dL   AST 448 (H)  15 - 41 U/L   ALT 252 (H) 0 - 44 U/L   Alkaline Phosphatase 133 (H) 38 - 126 U/L   Total Bilirubin 1.8 (H) 0.3 - 1.2 mg/dL   GFR calc non Af Amer >60 >60 mL/min   GFR calc Af Amer >60 >60 mL/min   Anion gap 13 5 - 15    Comment: Performed at [facility] [address], [place] [address] Lipase, blood     Status: None   Collection Time: 10/01/18  5:42 PM  Result Value Ref Range   Lipase 34 11 - 51 U/L    Comment: Performed at [facility], [place] [id number] CBC with Differential     Status: Abnormal   Collection Time: 10/01/18  5:42 PM  Result Value Ref Range   WBC 7.9 4.0 - 10.5 K/uL   RBC 5.51 (H) 3.87 - 5.11 MIL/uL   Hemoglobin 15.7 (H) 12.0 - 15.0 g/dL   HCT 46.8 (H) 36.0 - 46.0 %   MCV 84.9 80.0 - 100.0 fL   MCH 28.5 26.0 - 34.0 pg   MCHC 33.5 30.0 - 36.0 g/dL   RDW 13.6 11.5 - 15.5 %   Platelets 131 (L) 150 -  400 K/uL   nRBC 0.0 0.0 - 0.2 %   Neutrophils Relative % 73 %   Neutro Abs 5.8 1.7 - 7.7 K/uL   Lymphocytes Relative 17 %   Lymphs Abs 1.3 0.7 - 4.0 K/uL   Monocytes Relative 8 %   Monocytes Absolute 0.7 0.1 - 1.0 K/uL   Eosinophils Relative 1 %   Eosinophils Absolute 0.0 0.0 - 0.5 K/uL   Basophils Relative 1 %   Basophils Absolute 0.1 0.0 - 0.1 K/uL   Immature Granulocytes 0 %   Abs Immature Granulocytes 0.02 0.00 - 0.07 K/uL    Comment: Performed at [facility] [address] [place], [place] [address]  Urinalysis, Routine w reflex microscopic     Status: Abnormal   Collection Time: 10/01/18  8:22 PM  Result Value Ref Range   Color, Urine YELLOW YELLOW   APPearance CLEAR CLEAR   Specific Gravity, Urine 1.015 1.005 - 1.030   pH 8.0 5.0 - 8.0   Glucose, UA NEGATIVE NEGATIVE mg/dL   Hgb urine dipstick NEGATIVE NEGATIVE   Bilirubin Urine NEGATIVE NEGATIVE   Ketones, ur 20 (A) NEGATIVE mg/dL   Protein, ur NEGATIVE NEGATIVE mg/dL   Nitrite NEGATIVE NEGATIVE   Leukocytes,Ua NEGATIVE NEGATIVE     Comment: Performed at [facility] [address], [place], [place] [address]    US Abdomen Limited Ruq  Result Date: 10/01/2018 CLINICAL DATA:  Acute abdominal pain since earlier today. Nausea and vomiting. Elevated Jasmain Ahlberg count. EXAM: ULTRASOUND ABDOMEN LIMITED RIGHT UPPER QUADRANT COMPARISON:  None. FINDINGS: Gallbladder: Numerous gallstones, with gallbladder wall thickening. Gallbladder wall thickness 8 mm. Negative sonographic Murphy's sign. Largest single dimension calculus 4 mm. Common bile duct: Diameter: 3.4 mm. Liver: No focal lesion identified. Within normal limits in parenchymal echogenicity. Portal vein is patent on color Doppler imaging with normal direction of blood flow towards the liver. IMPRESSION: Cholelithiasis. Thick-walled gallbladder (8 mm) could represent acute or chronic cholecystitis, although no sonographic Murphy's sign is present. Furthermore, no pericholecystic fluid or biliary ductal dilatation. Correlate clinically. Electronically Signed   By: Staci Righter M.D.   On: 10/01/2018 21:41    ROS - all of the below systems have been reviewed with the patient and positives are indicated with bold text General: chills, fever or night sweats Eyes: blurry vision or double vision ENT: epistaxis or sore throat Allergy/Immunology: itchy/watery eyes or nasal congestion Hematologic/Lymphatic: bleeding problems, blood clots or swollen lymph nodes Endocrine: temperature intolerance or unexpected weight changes Breast: new or changing breast lumps or nipple discharge Resp: cough, shortness of breath, or wheezing CV: chest pain or dyspnea on exertion GI: as per HPI GU: dysuria, trouble voiding, or hematuria MSK: joint pain or joint stiffness Neuro: TIA or stroke symptoms Derm: pruritus and skin lesion changes Psych: anxiety and depression  PE Blood pressure (!) 186/87, pulse 70, temperature 98.1 F (36.7 C), temperature source Oral, resp. rate 16, height '5\' 3"'  (1.6 m),  weight 68 kg, SpO2 96 %. Constitutional: NAD; conversant; no deformities Eyes: Moist conjunctiva; no lid lag; anicteric; PERRL Neck: Trachea midline; no thyromegaly Lungs: Normal respiratory effort; no tactile fremitus CV: RRR; no palpable thrills; no pitting edema GI: Abd soft, mildly ttp in RUQ; negative Murphy's; nondistended; no palpable hepatosplenomegaly MSK: Normal gait; no clubbing/cyanosis Psychiatric: Appropriate affect; alert and oriented x3 Lymphatic: No palpable cervical or axillary lymphadenopathy  Results for orders placed or performed during the hospital encounter of 10/01/18 (from the past 48 hour(s))  Comprehensive metabolic panel     Status: Abnormal   Collection Time: 10/01/18  5:42 PM  Result Value Ref Range   Sodium 139 135 - 145 mmol/L   Potassium 4.2 3.5 - 5.1 mmol/L    Comment: SLIGHT HEMOLYSIS   Chloride 104 98 - 111 mmol/L   CO2 22 22 - 32 mmol/L   Glucose, Bld 115 (H) 70 - 99 mg/dL   BUN 14 8 - 23 mg/dL   Creatinine, Ser 0.95 0.44 - 1.00 mg/dL   Calcium 9.8 8.9 - 10.3 mg/dL   Total Protein 7.0 6.5 - 8.1 g/dL   Albumin 4.1 3.5 - 5.0 g/dL   AST 448 (H) 15 - 41 U/L   ALT 252 (H) 0 - 44 U/L   Alkaline Phosphatase 133 (H) 38 - 126 U/L   Total Bilirubin 1.8 (H) 0.3 - 1.2 mg/dL   GFR calc non Af Amer >60 >60 mL/min   GFR calc Af Amer >60 >60 mL/min   Anion gap 13 5 - 15    Comment: Performed at [facility] [address], [place], [place] [address]  Lipase, blood     Status: None   Collection Time: 10/01/18  5:42 PM  Result Value Ref Range   Lipase 34 11 - 51 U/L    Comment: Performed at [facility] [address], [place], [place] [address]  CBC with Differential     Status: Abnormal   Collection Time: 10/01/18  5:42 PM  Result Value Ref Range   WBC 7.9 4.0 - 10.5 K/uL   RBC 5.51 (H) 3.87 - 5.11 MIL/uL   Hemoglobin 15.7 (H) 12.0 - 15.0 g/dL   HCT 46.8 (H) 36.0 - 46.0 %   MCV 84.9 80.0 - 100.0 fL   MCH 28.5 26.0 - 34.0 pg   MCHC  33.5 30.0 - 36.0 g/dL   RDW 13.6 11.5 - 15.5 %   Platelets 131 (L) 150 - 400 K/uL   nRBC 0.0 0.0 - 0.2 %   Neutrophils Relative % 73 %   Neutro Abs 5.8 1.7 - 7.7 K/uL   Lymphocytes Relative 17 %   Lymphs Abs 1.3 0.7 - 4.0 K/uL   Monocytes Relative 8 %   Monocytes Absolute 0.7 0.1 - 1.0 K/uL   Eosinophils Relative 1 %   Eosinophils Absolute 0.0 0.0 - 0.5 K/uL   Basophils Relative 1 %   Basophils Absolute 0.1 0.0 - 0.1 K/uL   Immature Granulocytes 0 %   Abs Immature Granulocytes 0.02 0.00 - 0.07 K/uL    Comment: Performed at [facility] [address], [place], [place] [address]  Urinalysis, Routine w reflex microscopic     Status: Abnormal   Collection Time: 10/01/18  8:22 PM  Result Value Ref Range   Color, Urine YELLOW YELLOW   APPearance CLEAR CLEAR   Specific Gravity, Urine 1.015 1.005 - 1.030   pH 8.0 5.0 - 8.0   Glucose, UA NEGATIVE NEGATIVE mg/dL   Hgb urine dipstick NEGATIVE NEGATIVE   Bilirubin Urine NEGATIVE NEGATIVE   Ketones, ur 20 (A) NEGATIVE mg/dL   Protein, ur NEGATIVE NEGATIVE mg/dL   Nitrite NEGATIVE NEGATIVE   Leukocytes,Ua NEGATIVE NEGATIVE    Comment: Performed at [facility] [address], [place], [place] [address]    US Abdomen Limited Ruq  Result Date: 10/01/2018 CLINICAL DATA:  Acute abdominal pain since earlier today. Nausea and vomiting. Elevated [place] count. EXAM: ULTRASOUND  ABDOMEN LIMITED RIGHT UPPER QUADRANT COMPARISON:  None. FINDINGS: Gallbladder: Numerous gallstones, with gallbladder wall thickening. Gallbladder wall thickness 8 mm. Negative sonographic Murphy's sign. Largest single dimension calculus 4 mm. Common bile duct: Diameter: 3.4 mm. Liver: No focal lesion identified. Within normal limits in parenchymal echogenicity. Portal vein is patent on color Doppler imaging with normal direction of blood flow towards the liver. IMPRESSION: Cholelithiasis. Thick-walled gallbladder (8 mm) could represent acute or chronic  cholecystitis, although no sonographic Murphy's sign is present. Furthermore, no pericholecystic fluid or biliary ductal dilatation. Correlate clinically. Electronically Signed   By: Staci Righter M.D.   On: 10/01/2018 21:41    A/P: Valerie Obrien is an 73 y.o. female with HTN, HLD, hypothyroidism - here with likely choledocholithiasis, possible cholecystitis  -Admit; BP control -Repeat labs including LFTs in AM -NPO after midnight; MIVF -Empiric IV zosyn for possible cholecystitis - wall thickening on [place]; mildly ttp on exam -Will plan surgery this admission for her gallbladder - timing based on repeat lab work in AM -The anatomy and physiology of the hepatobiliary system was discussed at length with the patient. The pathophysiology of gallbladder disease was discussed at length as well. -The options for treatment were discussed including ongoing observation which may result in subsequent gallbladder complications (infection, pancreatitis, recurrent choledocholithiasis, etc). -We discussed laparoscopic cholecystectomy, possible intraop cholangiogram; we discussed timing of this being dependent on OR availability and her lab work. -The procedure, material risks (including, but not limited to, pain, bleeding, infection, scarring, need for blood transfusion, damage to surrounding structures- blood vessels/nerves/viscus/organs, damage to bile duct, bile leak, need for additional procedures, hernia, worsening of pre-existing medical conditions, pancreatitis, pneumonia, heart attack, stroke, death) benefits and alternatives to surgery were discussed at length. I noted a good probability that the procedure would help improve her symptoms. The patient's questions were answered to her satisfaction, she voiced understanding and they elected to proceed with surgery. Additionally, we discussed typical postoperative expectations and the recovery process. -We discussed that this would likely be performed by one of  my partners whom would be by to meet her as well to discuss  [place] Dema Severin, M.D. [facility]

## 2018-10-01 NOTE — ED Provider Notes (Signed)
[facility] EMERGENCY DEPARTMENT Provider Note   CSN: [id number] Arrival date & time: 10/01/18  1715    History   Chief Complaint Chief Complaint  Patient presents with  . Abdominal Pain  . Emesis    HPI Valerie Obrien is a 73 y.o. female with PMH/o HTN, High cholesterol who presents for evaluation of upper abdominal pain, nausea/vomiting that began today at approximate 11 AM.  She states that at about 11 AM, she developed some upper abdominal pain that radiated to the midsternal chest region.  She states that then the pain came down to the epigastric and over to the right upper quadrant.  She reports multiple episodes of nonbloody, nonbilious vomiting.  She called her PCP who arranged for a curbside evaluation.  She had blood drawn.  As she was driving home, she got a call from her primary care doctor, telling her that her liver functions were elevated and that she needs to go to the emergency department.  Patient reports she did eat breakfast today but was unable to eat lunch secondary to symptoms.  She has not eaten anything today.  Currently, she states she has some mild discomfort in the right upper quadrant region.  She has not had any dysuria or hematuria.  She denies any chest pain or difficulty breathing.  Patient denies any fevers.  She reports she was in her normal state of health yesterday.  Patient reports having normal bowel movement today with no presence of blood.     The history is provided by the patient.    Past Medical History:  Diagnosis Date  . High cholesterol   . Hypertension   . Thyroid disease    hypo    Patient Active Problem List   Diagnosis Date Noted  . Acute cholecystitis 10/01/2018  . Hypertensive urgency 10/01/2018  . KNEE PAIN 05/02/2010  . METATARSALGIA 05/02/2010  . FOOT PAIN, BILATERAL 05/02/2010    Past Surgical History:  Procedure Laterality Date  . BREAST EXCISIONAL BIOPSY Left   . CATARACT EXTRACTION     left eye      OB History   No obstetric history on file.      Home Medications    Prior to Admission medications   Medication Sig Start Date End Date Taking? Authorizing Provider  Calcium Carbonate-Vitamin D (CALTRATE 600+D PO) Take 2 tablets by mouth daily.    [provider]  citalopram (CELEXA) 20 MG tablet daily. as directed 01/24/15   [provider]  diclofenac sodium (VOLTAREN) 1 % GEL APPLY 2-4 GRAM TO AFFECTED AREA 4 TIMES DAILY AS NEEDED 01/27/15   [provider]  DIOVAN 80 MG tablet TAKE 1 TABLET EVERY DAY AS DIRECTED 01/24/15   [provider]  omeprazole (PRILOSEC) 10 MG capsule Take 10 mg by mouth daily.    [provider]  SYNTHROID 88 MCG tablet Take 88 mcg by mouth daily. 02/01/15   [provider]    Family History Family History  Family history unknown: Yes    Social History Social History   Tobacco Use  . Smoking status: Never Smoker  . Smokeless tobacco: Never Used  Substance Use Topics  . Alcohol use: Not Currently    Alcohol/week: 0.0 standard drinks  . Drug use: Never     Allergies   Sulfonamide derivatives   Review of Systems Review of Systems  Constitutional: Negative for fever.  Respiratory: Negative for cough and shortness of breath.   Cardiovascular:  Negative for chest pain.  Gastrointestinal: Positive for abdominal pain, nausea and vomiting. Negative for blood in stool.  Genitourinary: Negative for dysuria and hematuria.  Neurological: Negative for weakness, numbness and headaches.  All other systems reviewed and are negative.    Physical Exam Updated Vital Signs BP (!) 187/77   Pulse 61   Temp 98.1 F (36.7 C) (Oral)   Resp 16   Ht '5\' 3"'  (1.6 m)   Wt 68 kg   SpO2 98%   BMI 26.57 kg/m   Physical Exam Vitals signs and nursing note reviewed.  Constitutional:      Appearance: Normal appearance. She is well-developed.  HENT:     Head: Normocephalic and atraumatic.  Eyes:      General: Lids are normal.     Conjunctiva/sclera: Conjunctivae normal.     Pupils: Pupils are equal, round, and reactive to light.  Neck:     Musculoskeletal: Full passive range of motion without pain.  Cardiovascular:     Rate and Rhythm: Normal rate and regular rhythm.     Pulses: Normal pulses.          Radial pulses are 2+ on the right side and 2+ on the left side.       Dorsalis pedis pulses are 2+ on the right side and 2+ on the left side.     Heart sounds: Normal heart sounds. No murmur. No friction rub. No gallop.   Pulmonary:     Effort: Pulmonary effort is normal.     Breath sounds: Normal breath sounds.     Comments: Lungs clear to auscultation bilaterally.  Symmetric chest rise.  No wheezing, rales, rhonchi. Abdominal:     Palpations: Abdomen is soft. Abdomen is not rigid.     Tenderness: There is abdominal tenderness in the right upper quadrant. There is no right CVA tenderness, left CVA tenderness or guarding.     Comments: Abdomen is soft, non-distended.  Tenderness noted to the right upper quadrant.  No CVA tenderness noted bilaterally.  No rigidity, No guarding. No peritoneal signs.  Musculoskeletal: Normal range of motion.  Skin:    General: Skin is warm and dry.     Capillary Refill: Capillary refill takes less than 2 seconds.  Neurological:     Mental Status: She is alert and oriented to person, place, and time.  Psychiatric:        Speech: Speech normal.      ED Treatments / Results  Labs (all labs ordered are listed, but only abnormal results are displayed) Labs Reviewed  URINALYSIS, ROUTINE W REFLEX MICROSCOPIC - Abnormal; Notable for the following components:      Result Value   Ketones, ur 20 (*)    All other components within normal limits  COMPREHENSIVE METABOLIC PANEL - Abnormal; Notable for the following components:   Glucose, Bld 115 (*)    AST 448 (*)    ALT 252 (*)    Alkaline Phosphatase 133 (*)    Total Bilirubin 1.8 (*)    All other  components within normal limits  CBC WITH DIFFERENTIAL/PLATELET - Abnormal; Notable for the following components:   RBC 5.51 (*)    Hemoglobin 15.7 (*)    HCT 46.8 (*)    Platelets 131 (*)    All other components within normal limits  LIPASE, BLOOD    EKG EKG Interpretation  Date/Time:  Wednesday October 01 2018 17:58:18 EDT Ventricular Rate:  66 PR Interval:  162 QRS Duration:  84 QT Interval:  458 QTC Calculation: 480 R Axis:   96 Text Interpretation:  Normal sinus rhythm Rightward axis Cannot rule out Anterior infarct , age undetermined Abnormal ECG Confirmed by Lennice Sites [phone]) on 10/01/2018 6:04:44 PM   Radiology US Abdomen Limited Ruq  Result Date: 10/01/2018 CLINICAL DATA:  Acute abdominal pain since earlier today. Nausea and vomiting. Elevated white count. EXAM: ULTRASOUND ABDOMEN LIMITED RIGHT UPPER QUADRANT COMPARISON:  None. FINDINGS: Gallbladder: Numerous gallstones, with gallbladder wall thickening. Gallbladder wall thickness 8 mm. Negative sonographic Murphy's sign. Largest single dimension calculus 4 mm. Common bile duct: Diameter: 3.4 mm. Liver: No focal lesion identified. Within normal limits in parenchymal echogenicity. Portal vein is patent on color Doppler imaging with normal direction of blood flow towards the liver. IMPRESSION: Cholelithiasis. Thick-walled gallbladder (8 mm) could represent acute or chronic cholecystitis, although no sonographic Murphy's sign is present. Furthermore, no pericholecystic fluid or biliary ductal dilatation. Correlate clinically. Electronically Signed   By: Staci Righter M.D.   On: 10/01/2018 21:41    Procedures Procedures (including critical care time)  Medications Ordered in ED Medications  fentaNYL (SUBLIMAZE) injection 50 mcg (50 mcg Intravenous Not Given 10/01/18 1949)  piperacillin-tazobactam (ZOSYN) IVPB 3.375 g (3.375 g Intravenous New Bag/Given 10/01/18 2230)  ondansetron (ZOFRAN-ODT) disintegrating tablet 4 mg (4 mg  Oral Given 10/01/18 1833)  metoprolol tartrate (LOPRESSOR) injection 5 mg (5 mg Intravenous Given 10/01/18 2227)     Initial Impression / Assessment and Plan / ED Course  I have reviewed the triage vital signs and the nursing notes.  Pertinent labs & imaging results that were available during my care of the patient were reviewed by me and considered in my medical decision making (see chart for details).        73 year old female who presents for evaluation of upper abdominal pain, nausea/vomiting. Saw PCP today who told her that her LFTs were elevated to go to the emergency department. Patient is afebril, non-toxic appearing, sitting comfortably on examination table.  Signs reviewed.  She is hypertensive on initial evaluation.  Patient reports that she did take her medication prior to vomiting today.  Additionally, she takes a nighttime dose which she has not taken.  Patient not complaining of any chest pain, difficulty breathing, numbness/weakness of arms or legs, vision changes.  I suspect it is elevated due to anxiety/pain.  We will plan to check basic labs, right upper quadrant ultrasound.  Concern for infectious process versus hepatobiliary etiology.  History/physical exam not concerning for ACS etiology, aortic dissection.  Lipase unremarkable.  CBC shows no evidence of leukocytosis.  Hemoglobin is 15.7, hematocrit is 46.8.  CMP shows AST of 448, ALT of 252, alk phos of 133, total bili of 1.8.  No previous blood work for comparison.  UA negative for any infectious etiology.  Shows numerous gallstones with gallbladder wall thickening.  Gallbladder wall thickness measures 8 mm.  CBD diameter is 3.4.  There is mention of that this could be acute or chronic cholecystitis.  In elevation in LFTs with no fires for comparison.  Will consult surgery.  Discussed patient with Dr. Dema Severin (Gen Surg).  Recommends starting patient on Zosyn.  He will plan to see the patient in consultation.  Recommends repeat  labs tomorrow.  If LFTs are downtrending, will plan for cholecystectomy.  If LFTs continue to elevate, may need GI consult.  Recommends medical admission.  Discussed patient with Dr. Hal Hope (hospitalist). Will plan to admit.   Portions of this note  were generated with [employer]. Dictation errors may occur despite best attempts at proofreading.   Final Clinical Impressions(s) / ED Diagnoses   Final diagnoses:  Abdominal pain  Cholecystitis    ED Discharge Orders    None       Volanda Napoleon, PA-C 10/01/18 2246    Lennice Sites, DO 10/02/18 [phone]

## 2018-10-01 NOTE — ED Triage Notes (Signed)
Pt sent by PCP who took blood and evaluated her, started throwing up at 1130 with epigastric abdominal pain that spread to RUQ and felt like "burning", pain has eased. 5 episodes of emesis. Blood work showed liver function elevation and slightly elevated WBC's, PCP concerned for gall stones. Pt took her bp medicine this morning.

## 2018-10-01 NOTE — H&P (Signed)
History and Physical    Valerie Obrien:[id number] DOB: 1945/11/28 DOA: 10/01/2018  PCP: Shon Baton, MD  Patient coming from: Home.  Chief Complaint: Abdominal pain.  HPI: Valerie Obrien is a 73 y.o. female with history of hypertension and hypothyroidism was referred to the ER after patient labs came abnormal done by her primary care physician.  Patient states this morning around 11:00 patient started developing epigastric and right upper quadrant pain following which patient had at least 5 episodes of vomiting.  No blood in the vomitus.  Pain lasted for almost 2 to 3 hours.  Had gone to her PCP and had labs done which showed elevated LFTs and was advised to come to the ER.  By then patient's pain is resolved.  No diarrhea.  Denies any chest pain productive cough fever or chills.  No contact with any COVID-19 patients.  ED Course: In the ER patient had a sonogram of the abdomen done which shows gallstones with thickened gallbladder negative Murphy sign.  LFTs were elevated at AST of 448 ALT of 252 bilirubin 1.8.  EKG was showing normal sinus rhythm.  On-call general surgeon Dr. Dema Severin has been consulted and patient admitted for further management.  At admission patient blood pressure was markedly elevated at systolic more than 672.  Review of Systems: As per HPI, rest all negative.   Past Medical History:  Diagnosis Date  . High cholesterol   . Hypertension   . Thyroid disease    hypo    Past Surgical History:  Procedure Laterality Date  . BREAST EXCISIONAL BIOPSY Left   . CATARACT EXTRACTION     left eye     reports that she has never smoked. She has never used smokeless tobacco. She reports previous alcohol use. She reports that she does not use drugs.  Allergies  Allergen Reactions  . Sulfonamide Derivatives     Family History  Family history unknown: Yes    Prior to Admission medications   Medication Sig Start Date End Date Taking? Authorizing Provider  Calcium  Carbonate-Vitamin D (CALTRATE 600+D PO) Take 2 tablets by mouth daily.    [provider]  citalopram (CELEXA) 20 MG tablet daily. as directed 01/24/15   [provider]  diclofenac sodium (VOLTAREN) 1 % GEL APPLY 2-4 GRAM TO AFFECTED AREA 4 TIMES DAILY AS NEEDED 01/27/15   [provider]  DIOVAN 80 MG tablet TAKE 1 TABLET EVERY DAY AS DIRECTED 01/24/15   [provider]  omeprazole (PRILOSEC) 10 MG capsule Take 10 mg by mouth daily.    [provider]  SYNTHROID 88 MCG tablet Take 88 mcg by mouth daily. 02/01/15   [provider]    Physical Exam: Vitals:   10/01/18 2000 10/01/18 2146 10/01/18 2200 10/01/18 2230  BP: (!) 170/67 (!) 186/87 (!) 183/82 (!) 187/77  Pulse: 73 70 65 61  Resp:  16    Temp:      TempSrc:      SpO2: 99% 96% 99% 98%  Weight:      Height:          Constitutional: Moderately built and nourished. Vitals:   10/01/18 2000 10/01/18 2146 10/01/18 2200 10/01/18 2230  BP: (!) 170/67 (!) 186/87 (!) 183/82 (!) 187/77  Pulse: 73 70 65 61  Resp:  16    Temp:      TempSrc:      SpO2: 99% 96% 99% 98%  Weight:  Height:       Eyes: Anicteric no pallor. ENMT: No discharge from the ears eyes nose or mouth. Neck: No mass felt.  No neck rigidity. Respiratory: No rhonchi or crepitations. Cardiovascular: S1-S2 heard. Abdomen: Soft nontender bowel sounds present. Musculoskeletal: No edema. Skin: No rash. Neurologic: Alert awake oriented to time place and person.  Moves all extremities. Psychiatric: Appears normal per normal affect.   Labs on Admission: I have personally reviewed following labs and imaging studies  CBC: Recent Labs  Lab 10/01/18 1742  WBC 7.9  NEUTROABS 5.8  HGB 15.7*  HCT 46.8*  MCV 84.9  PLT 431*   Basic Metabolic Panel: Recent Labs  Lab 10/01/18 1742  NA 139  K 4.2  CL 104  CO2 22  GLUCOSE 115*  BUN 14  CREATININE 0.95  CALCIUM 9.8   GFR: Estimated Creatinine Clearance:  51 mL/min (by C-G formula based on SCr of 0.95 mg/dL). Liver Function Tests: Recent Labs  Lab 10/01/18 1742  AST 448*  ALT 252*  ALKPHOS 133*  BILITOT 1.8*  PROT 7.0  ALBUMIN 4.1   Recent Labs  Lab 10/01/18 1742  LIPASE 34   No results for input(s): AMMONIA in the last 168 hours. Coagulation Profile: No results for input(s): INR, PROTIME in the last 168 hours. Cardiac Enzymes: No results for input(s): CKTOTAL, CKMB, CKMBINDEX, TROPONINI in the last 168 hours. BNP (last 3 results) No results for input(s): PROBNP in the last 8760 hours. HbA1C: No results for input(s): HGBA1C in the last 72 hours. CBG: No results for input(s): GLUCAP in the last 168 hours. Lipid Profile: No results for input(s): CHOL, HDL, LDLCALC, TRIG, CHOLHDL, LDLDIRECT in the last 72 hours. Thyroid Function Tests: No results for input(s): TSH, T4TOTAL, FREET4, T3FREE, THYROIDAB in the last 72 hours. Anemia Panel: No results for input(s): VITAMINB12, FOLATE, FERRITIN, TIBC, IRON, RETICCTPCT in the last 72 hours. Urine analysis:    Component Value Date/Time   COLORURINE YELLOW 10/01/2018 2022   APPEARANCEUR CLEAR 10/01/2018 2022   LABSPEC 1.015 10/01/2018 2022   PHURINE 8.0 10/01/2018 2022   GLUCOSEU NEGATIVE 10/01/2018 2022   HGBUR NEGATIVE 10/01/2018 2022   [place] NEGATIVE 10/01/2018 2022   KETONESUR 20 (A) 10/01/2018 2022   PROTEINUR NEGATIVE 10/01/2018 2022   NITRITE NEGATIVE 10/01/2018 2022   LEUKOCYTESUR NEGATIVE 10/01/2018 2022   Sepsis Labs: @LABRCNTIP (procalcitonin:4,lacticidven:4) )No results found for this or any previous visit (from the past 240 hour(s)).   Radiological Exams on Admission: US Abdomen Limited Ruq  Result Date: 10/01/2018 CLINICAL DATA:  Acute abdominal pain since earlier today. Nausea and vomiting. Elevated white count. EXAM: ULTRASOUND ABDOMEN LIMITED RIGHT UPPER QUADRANT COMPARISON:  None. FINDINGS: Gallbladder: Numerous gallstones, with gallbladder wall  thickening. Gallbladder wall thickness 8 mm. Negative sonographic Murphy's sign. Largest single dimension calculus 4 mm. Common bile duct: Diameter: 3.4 mm. Liver: No focal lesion identified. Within normal limits in parenchymal echogenicity. Portal vein is patent on color Doppler imaging with normal direction of blood flow towards the liver. IMPRESSION: Cholelithiasis. Thick-walled gallbladder (8 mm) could represent acute or chronic cholecystitis, although no sonographic Murphy's sign is present. Furthermore, no pericholecystic fluid or biliary ductal dilatation. Correlate clinically. Electronically Signed   By: Staci Righter M.D.   On: 10/01/2018 21:41    EKG: Independently reviewed.  Normal sinus rhythm.  Assessment/Plan Principal Problem:   Acute cholecystitis Active Problems:   Hypertensive urgency   Hypothyroidism    1. Acute cholecystitis with elevated LFTs -appreciate general surgery consult  patient will be kept n.p.o. on IV antibiotics.  IV fluids pain relief medications.  Repeat LFTs to make sure it going down outpatient has to be ruled out for choledocholithiasis. 2. Hypertensive urgency -since patient is n.p.o. we will keep patient on PRN IV hydralazine and closely follow blood pressure trends. 3. Hypothyroidism on IV Synthroid replacement until patient can take orally. 4. History of depression presently being weaned off citalopram as per the patient.   DVT prophylaxis: SCDs. Code Status: Full code. Family Communication: Discussed with patient. Disposition Plan: Home. Consults called: General surgery. Admission status: Inpatient.   Rise Patience MD [facility] Pager [phone].  If 7PM-7AM, please contact night-coverage [email or web address] Password [email or web address]  10/01/2018, 10:50 PM

## 2018-10-01 NOTE — ED Notes (Addendum)
MD Curatolo notified about 048 systolic bp, will continue to monitor.

## 2018-10-01 NOTE — Progress Notes (Signed)
Pharmacy Antibiotic Note  Valerie Obrien is a 73 y.o. female admitted on 10/01/2018 with cholecystitis.  Pharmacy has been consulted for zosyn dosing.  SCr 0.95, CrCl ~51 ml/min  Plan: Zosyn 3.375g IV every 8 hours Monitor renal function, clinical progression and LOT  Height: 5\' 3"  (160 cm) Weight: 150 lb (68 kg) IBW/kg (Calculated) : 52.4  Temp (24hrs), Avg:98.1 F (36.7 C), Min:98.1 F (36.7 C), Max:98.1 F (36.7 C)  Recent Labs  Lab 10/01/18 1742  WBC 7.9  CREATININE 0.95    Estimated Creatinine Clearance: 51 mL/min (by C-G formula based on SCr of 0.95 mg/dL).    Allergies  Allergen Reactions  . Sulfonamide Derivatives     Antimicrobials this admission: Zosyn 4/22>>  Dose adjustments this admission: n/a  Microbiology results:   Bertis Ruddy, PharmD Clinical Pharmacist Please check AMION for all [place] numbers 10/01/2018 10:04 PM

## 2018-10-02 ENCOUNTER — Inpatient Hospital Stay (HOSPITAL_COMMUNITY): Payer: Medicare Other | Admitting: Anesthesiology

## 2018-10-02 ENCOUNTER — Inpatient Hospital Stay (HOSPITAL_COMMUNITY): Payer: Medicare Other

## 2018-10-02 ENCOUNTER — Encounter (HOSPITAL_COMMUNITY): Payer: Self-pay

## 2018-10-02 ENCOUNTER — Encounter (HOSPITAL_COMMUNITY)
Admission: EM | Disposition: A | Discharge status: Home / Self Care | Payer: Self-pay | Source: Home / Self Care | Attending: Emergency Medicine

## 2018-10-02 DIAGNOSIS — I160 Hypertensive urgency: Secondary | ICD-10-CM | POA: Diagnosis not present

## 2018-10-02 DIAGNOSIS — K8010 Calculus of gallbladder with chronic cholecystitis without obstruction: Secondary | ICD-10-CM | POA: Diagnosis not present

## 2018-10-02 DIAGNOSIS — R7989 Other specified abnormal findings of blood chemistry: Secondary | ICD-10-CM | POA: Diagnosis not present

## 2018-10-02 DIAGNOSIS — R21 Rash and other nonspecific skin eruption: Secondary | ICD-10-CM | POA: Diagnosis not present

## 2018-10-02 DIAGNOSIS — K810 Acute cholecystitis: Secondary | ICD-10-CM | POA: Diagnosis not present

## 2018-10-02 HISTORY — PX: CHOLECYSTECTOMY: SHX55

## 2018-10-02 LAB — BASIC METABOLIC PANEL
Anion gap: 9 (ref 5–15)
BUN: 10 mg/dL (ref 8–23)
CO2: 23 mmol/L (ref 22–32)
Calcium: 8.8 mg/dL — ABNORMAL LOW (ref 8.9–10.3)
Chloride: 109 mmol/L (ref 98–111)
Creatinine, Ser: 0.86 mg/dL (ref 0.44–1.00)
GFR calc Af Amer: >60 mL/min (ref >60)
GFR calc non Af Amer: >60 mL/min (ref >60)
Glucose, Bld: 140 mg/dL — ABNORMAL HIGH (ref 70–99)
Potassium: 3.3 mmol/L — ABNORMAL LOW (ref 3.5–5.1)
Sodium: 141 mmol/L (ref 135–145)

## 2018-10-02 LAB — HEPATIC FUNCTION PANEL
ALT: 219 U/L — ABNORMAL HIGH (ref 0–44)
AST: 180 U/L — ABNORMAL HIGH (ref 15–41)
Albumin: 3.5 g/dL (ref 3.5–5.0)
Alkaline Phosphatase: 137 U/L — ABNORMAL HIGH (ref 38–126)
Bilirubin, Direct: 0.2 mg/dL (ref 0.0–0.2)
Indirect Bilirubin: 0.9 mg/dL (ref 0.3–0.9)
Total Bilirubin: 1.1 mg/dL (ref 0.3–1.2)
Total Protein: 6.3 g/dL — ABNORMAL LOW (ref 6.5–8.1)

## 2018-10-02 LAB — CBC WITH DIFFERENTIAL/PLATELET
Abs Immature Granulocytes: 0.02 10*3/uL (ref 0.00–0.07)
Basophils Absolute: 0.1 10*3/uL (ref 0.0–0.1)
Basophils Relative: 1 %
Eosinophils Absolute: 0 10*3/uL (ref 0.0–0.5)
Eosinophils Relative: 1 %
HCT: 46.9 % — ABNORMAL HIGH (ref 36.0–46.0)
Hemoglobin: 15.6 g/dL — ABNORMAL HIGH (ref 12.0–15.0)
Immature Granulocytes: 0 %
Lymphocytes Relative: 22 %
Lymphs Abs: 1.3 10*3/uL (ref 0.7–4.0)
MCH: 28.2 pg (ref 26.0–34.0)
MCHC: 33.3 g/dL (ref 30.0–36.0)
MCV: 84.7 fL (ref 80.0–100.0)
Monocytes Absolute: 0.6 10*3/uL (ref 0.1–1.0)
Monocytes Relative: 10 %
Neutro Abs: 3.9 10*3/uL (ref 1.7–7.7)
Neutrophils Relative %: 66 %
Platelets: 208 10*3/uL (ref 150–400)
RBC: 5.54 MIL/uL — ABNORMAL HIGH (ref 3.87–5.11)
RDW: 14 % (ref 11.5–15.5)
WBC: 6 10*3/uL (ref 4.0–10.5)
nRBC: 0 % (ref 0.0–0.2)

## 2018-10-02 LAB — SURGICAL PCR SCREEN
MRSA, PCR: NEGATIVE
Staphylococcus aureus: NEGATIVE

## 2018-10-02 LAB — CBC
HCT: 44 % (ref 36.0–46.0)
Hemoglobin: 14.6 g/dL (ref 12.0–15.0)
MCH: 28.4 pg (ref 26.0–34.0)
MCHC: 33.2 g/dL (ref 30.0–36.0)
MCV: 85.6 fL (ref 80.0–100.0)
Platelets: 228 10*3/uL (ref 150–400)
RBC: 5.14 MIL/uL — ABNORMAL HIGH (ref 3.87–5.11)
RDW: 14.3 % (ref 11.5–15.5)
WBC: 9.2 10*3/uL (ref 4.0–10.5)
nRBC: 0 % (ref 0.0–0.2)

## 2018-10-02 LAB — HIV ANTIBODY (ROUTINE TESTING W REFLEX): HIV Screen 4th Generation wRfx: NONREACTIVE

## 2018-10-02 LAB — C-REACTIVE PROTEIN: CRP: <0.8 mg/dL (ref <1.0)

## 2018-10-02 SURGERY — LAPAROSCOPIC CHOLECYSTECTOMY WITH INTRAOPERATIVE CHOLANGIOGRAM
Anesthesia: General | Site: Abdomen

## 2018-10-02 MED ORDER — OXYCODONE HCL 5 MG PO TABS
5.0000 mg | ORAL_TABLET | Freq: Once | ORAL | Status: AC | PRN
  Administered 2018-10-02: 11:00:00 5 mg via ORAL

## 2018-10-02 MED ORDER — SUCCINYLCHOLINE CHLORIDE 200 MG/10ML IV SOSY
PREFILLED_SYRINGE | INTRAVENOUS | Status: AC
  Filled 2018-10-02: qty 10

## 2018-10-02 MED ORDER — PROPOFOL 10 MG/ML IV BOLUS
INTRAVENOUS | Status: AC
  Filled 2018-10-02: qty 20

## 2018-10-02 MED ORDER — EPHEDRINE SULFATE-NACL 50-0.9 MG/10ML-% IV SOSY
PREFILLED_SYRINGE | INTRAVENOUS | Status: DC | PRN
  Administered 2018-10-02: 15 mg via INTRAVENOUS
  Administered 2018-10-02: 10 mg via INTRAVENOUS

## 2018-10-02 MED ORDER — HYDRALAZINE HCL 20 MG/ML IJ SOLN
INTRAMUSCULAR | Status: DC | PRN
  Administered 2018-10-02: 10 mg via INTRAVENOUS

## 2018-10-02 MED ORDER — ONDANSETRON HCL 4 MG/2ML IJ SOLN
INTRAMUSCULAR | Status: DC | PRN
  Administered 2018-10-02: 4 mg via INTRAVENOUS

## 2018-10-02 MED ORDER — SUGAMMADEX SODIUM 200 MG/2ML IV SOLN
INTRAVENOUS | Status: DC | PRN
  Administered 2018-10-02: 200 mg via INTRAVENOUS

## 2018-10-02 MED ORDER — KCL IN DEXTROSE-NACL 40-5-0.9 MEQ/L-%-% IV SOLN
INTRAVENOUS | Status: DC
  Administered 2018-10-02 – 2018-10-03 (×3): via INTRAVENOUS
  Filled 2018-10-02 (×5): qty 1000

## 2018-10-02 MED ORDER — ONDANSETRON HCL 4 MG/2ML IJ SOLN
4.0000 mg | Freq: Once | INTRAMUSCULAR | Status: AC | PRN
  Administered 2018-10-02: 11:00:00 4 mg via INTRAVENOUS

## 2018-10-02 MED ORDER — SUCCINYLCHOLINE CHLORIDE 200 MG/10ML IV SOSY
PREFILLED_SYRINGE | INTRAVENOUS | Status: DC | PRN
  Administered 2018-10-02: 80 mg via INTRAVENOUS

## 2018-10-02 MED ORDER — SODIUM CHLORIDE 0.9 % IR SOLN
Status: DC | PRN
  Administered 2018-10-02: 1

## 2018-10-02 MED ORDER — LEVOTHYROXINE SODIUM 100 MCG/5ML IV SOLN
66.0000 ug | Freq: Every day | INTRAVENOUS | Status: DC
  Administered 2018-10-03: 10:00:00 66 ug via INTRAVENOUS
  Filled 2018-10-02: qty 5

## 2018-10-02 MED ORDER — LIDOCAINE 2% (20 MG/ML) 5 ML SYRINGE
INTRAMUSCULAR | Status: DC | PRN
  Administered 2018-10-02: 60 mg via INTRAVENOUS

## 2018-10-02 MED ORDER — ROCURONIUM BROMIDE 10 MG/ML (PF) SYRINGE
PREFILLED_SYRINGE | INTRAVENOUS | Status: DC | PRN
  Administered 2018-10-02: 30 mg via INTRAVENOUS

## 2018-10-02 MED ORDER — OXYCODONE HCL 5 MG PO TABS: 5.0000 mg | ORAL_TABLET | Freq: Four times a day (QID) | ORAL | 0 refills | Status: DC | PRN

## 2018-10-02 MED ORDER — LACTATED RINGERS IV SOLN
INTRAVENOUS | Status: DC | PRN
  Administered 2018-10-02 (×2): via INTRAVENOUS

## 2018-10-02 MED ORDER — FENTANYL CITRATE (PF) 100 MCG/2ML IJ SOLN
INTRAMUSCULAR | Status: AC
  Filled 2018-10-02: qty 2

## 2018-10-02 MED ORDER — METRONIDAZOLE IN NACL 5-0.79 MG/ML-% IV SOLN
500.0000 mg | Freq: Three times a day (TID) | INTRAVENOUS | Status: DC
  Administered 2018-10-02: 500 mg via INTRAVENOUS
  Filled 2018-10-02: qty 100

## 2018-10-02 MED ORDER — OXYCODONE HCL 5 MG PO TABS
5.0000 mg | ORAL_TABLET | ORAL | Status: DC | PRN
  Administered 2018-10-02: 21:00:00 5 mg via ORAL
  Filled 2018-10-02: qty 2

## 2018-10-02 MED ORDER — MIDAZOLAM HCL 5 MG/5ML IJ SOLN
INTRAMUSCULAR | Status: DC | PRN
  Administered 2018-10-02: 2 mg via INTRAVENOUS

## 2018-10-02 MED ORDER — FENTANYL CITRATE (PF) 100 MCG/2ML IJ SOLN
25.0000 ug | INTRAMUSCULAR | Status: DC | PRN
  Administered 2018-10-02 (×2): 50 ug via INTRAVENOUS

## 2018-10-02 MED ORDER — ONDANSETRON HCL 4 MG/2ML IJ SOLN
INTRAMUSCULAR | Status: AC
  Filled 2018-10-02: qty 2

## 2018-10-02 MED ORDER — DIPHENHYDRAMINE HCL 50 MG/ML IJ SOLN
12.5000 mg | Freq: Once | INTRAMUSCULAR | Status: AC
  Administered 2018-10-02: 07:00:00 12.5 mg via INTRAVENOUS
  Filled 2018-10-02: qty 1

## 2018-10-02 MED ORDER — 0.9 % SODIUM CHLORIDE (POUR BTL) OPTIME
TOPICAL | Status: DC | PRN
  Administered 2018-10-02: 11:00:00 1000 mL

## 2018-10-02 MED ORDER — CIPROFLOXACIN IN D5W 400 MG/200ML IV SOLN
400.0000 mg | Freq: Two times a day (BID) | INTRAVENOUS | Status: DC
  Filled 2018-10-02: qty 200

## 2018-10-02 MED ORDER — POTASSIUM CHLORIDE 10 MEQ/100ML IV SOLN
10.0000 meq | INTRAVENOUS | Status: AC
  Filled 2018-10-02: qty 100

## 2018-10-02 MED ORDER — FENTANYL CITRATE (PF) 250 MCG/5ML IJ SOLN
INTRAMUSCULAR | Status: AC
  Filled 2018-10-02: qty 5

## 2018-10-02 MED ORDER — SODIUM CHLORIDE 0.9% FLUSH: 10.0000 mL | INTRAVENOUS | Status: DC | PRN

## 2018-10-02 MED ORDER — BUPIVACAINE-EPINEPHRINE (PF) 0.25% -1:200000 IJ SOLN
INTRAMUSCULAR | Status: AC
  Filled 2018-10-02: qty 30

## 2018-10-02 MED ORDER — BUPIVACAINE-EPINEPHRINE 0.25% -1:200000 IJ SOLN
INTRAMUSCULAR | Status: DC | PRN
  Administered 2018-10-02: 11 mL

## 2018-10-02 MED ORDER — OXYCODONE HCL 5 MG PO TABS
ORAL_TABLET | ORAL | Status: AC
  Filled 2018-10-02: qty 1

## 2018-10-02 MED ORDER — FENTANYL CITRATE (PF) 100 MCG/2ML IJ SOLN
INTRAMUSCULAR | Status: DC | PRN
  Administered 2018-10-02: 50 ug via INTRAVENOUS
  Administered 2018-10-02: 150 ug via INTRAVENOUS

## 2018-10-02 MED ORDER — MIDAZOLAM HCL 2 MG/2ML IJ SOLN
INTRAMUSCULAR | Status: AC
  Filled 2018-10-02: qty 2

## 2018-10-02 MED ORDER — POTASSIUM CHLORIDE 10 MEQ/100ML IV SOLN
10.0000 meq | INTRAVENOUS | Status: AC
  Administered 2018-10-02 (×2): 10 meq via INTRAVENOUS
  Filled 2018-10-02 (×2): qty 100

## 2018-10-02 MED ORDER — MORPHINE SULFATE (PF) 2 MG/ML IV SOLN
2.0000 mg | INTRAVENOUS | Status: DC | PRN
  Filled 2018-10-02: qty 1

## 2018-10-02 MED ORDER — IBUPROFEN 600 MG PO TABS: 600.0000 mg | ORAL_TABLET | Freq: Four times a day (QID) | ORAL | Status: DC | PRN

## 2018-10-02 MED ORDER — OXYCODONE HCL 5 MG/5ML PO SOLN: 5.0000 mg | Freq: Once | ORAL | Status: AC | PRN

## 2018-10-02 MED ORDER — IBUPROFEN 600 MG PO TABS
600.0000 mg | ORAL_TABLET | Freq: Four times a day (QID) | ORAL | Status: DC | PRN
  Administered 2018-10-03 (×2): 600 mg via ORAL
  Filled 2018-10-02 (×2): qty 1

## 2018-10-02 MED ORDER — ROCURONIUM BROMIDE 50 MG/5ML IV SOSY
PREFILLED_SYRINGE | INTRAVENOUS | Status: AC
  Filled 2018-10-02: qty 5

## 2018-10-02 MED ORDER — LABETALOL HCL 5 MG/ML IV SOLN
INTRAVENOUS | Status: DC | PRN
  Administered 2018-10-02 (×2): 10 mg via INTRAVENOUS

## 2018-10-02 MED ORDER — DEXAMETHASONE SODIUM PHOSPHATE 10 MG/ML IJ SOLN
INTRAMUSCULAR | Status: DC | PRN
  Administered 2018-10-02: 4 mg via INTRAVENOUS

## 2018-10-02 MED ORDER — LIDOCAINE 2% (20 MG/ML) 5 ML SYRINGE
INTRAMUSCULAR | Status: AC
  Filled 2018-10-02: qty 5

## 2018-10-02 MED ORDER — SODIUM CHLORIDE 0.9 % IV SOLN
INTRAVENOUS | Status: DC | PRN
  Administered 2018-10-02: 11:00:00 10 mL

## 2018-10-02 MED ORDER — PROPOFOL 10 MG/ML IV BOLUS
INTRAVENOUS | Status: DC | PRN
  Administered 2018-10-02: 20 mg via INTRAVENOUS
  Administered 2018-10-02: 140 mg via INTRAVENOUS

## 2018-10-02 MED ORDER — ACETAMINOPHEN 325 MG PO TABS: 650.0000 mg | ORAL_TABLET | Freq: Four times a day (QID) | ORAL | Status: AC | PRN

## 2018-10-02 SURGICAL SUPPLY — 49 items
APL SKNCLS STERI-STRIP NONHPOA (GAUZE/BANDAGES/DRESSINGS) ×1
APPLIER CLIP ROT 10 11.4 M/L (STAPLE) ×3
APR CLP MED LRG 11.4X10 (STAPLE) ×1
BAG SPEC RTRVL 10 TROC 200 (ENDOMECHANICALS) ×1
BAG SPEC RTRVL LRG 6X4 10 (ENDOMECHANICALS)
BENZOIN TINCTURE PRP APPL 2/3 (GAUZE/BANDAGES/DRESSINGS) ×3 IMPLANT
BLADE CLIPPER SURG (BLADE) ×2 IMPLANT
CANISTER SUCT 3000ML PPV (MISCELLANEOUS) ×3 IMPLANT
CHLORAPREP W/TINT 26ML (MISCELLANEOUS) ×3 IMPLANT
CLIP APPLIE ROT 10 11.4 M/L (STAPLE) ×1 IMPLANT
CLOSURE WOUND 1/2 X4 (GAUZE/BANDAGES/DRESSINGS) ×1
COVER MAYO STAND STRL (DRAPES) ×3 IMPLANT
COVER SURGICAL LIGHT HANDLE (MISCELLANEOUS) ×3 IMPLANT
COVER WAND RF STERILE (DRAPES) ×3 IMPLANT
DRAPE C-ARM 42X72 X-RAY (DRAPES) ×3 IMPLANT
DRSG TEGADERM 2-3/8X2-3/4 SM (GAUZE/BANDAGES/DRESSINGS) ×9 IMPLANT
DRSG TEGADERM 4X4.75 (GAUZE/BANDAGES/DRESSINGS) ×3 IMPLANT
ELECT REM PT RETURN 9FT ADLT (ELECTROSURGICAL) ×3
ELECTRODE REM PT RTRN 9FT ADLT (ELECTROSURGICAL) ×1 IMPLANT
GAUZE SPONGE 2X2 8PLY STRL LF (GAUZE/BANDAGES/DRESSINGS) ×1 IMPLANT
GLOVE BIO SURGEON STRL SZ7 (GLOVE) ×3 IMPLANT
GLOVE BIOGEL PI IND STRL 7.5 (GLOVE) ×1 IMPLANT
GLOVE BIOGEL PI INDICATOR 7.5 (GLOVE) ×2
GOWN STRL REUS W/ TWL LRG LVL3 (GOWN DISPOSABLE) ×3 IMPLANT
GOWN STRL REUS W/TWL LRG LVL3 (GOWN DISPOSABLE) ×9
HEMOSTAT SNOW SURGICEL 2X4 (HEMOSTASIS) ×2 IMPLANT
KIT BASIN OR (CUSTOM PROCEDURE TRAY) ×3 IMPLANT
KIT TURNOVER KIT B (KITS) ×3 IMPLANT
NS IRRIG 1000ML POUR BTL (IV SOLUTION) ×3 IMPLANT
PAD ARMBOARD 7.5X6 YLW CONV (MISCELLANEOUS) ×3 IMPLANT
POUCH RETRIEVAL ECOSAC 10 (ENDOMECHANICALS) IMPLANT
POUCH RETRIEVAL ECOSAC 10MM (ENDOMECHANICALS) ×2
POUCH SPECIMEN RETRIEVAL 10MM (ENDOMECHANICALS) IMPLANT
SCISSORS LAP 5X35 DISP (ENDOMECHANICALS) ×3 IMPLANT
SET CHOLANGIOGRAPH 5 50 .035 (SET/KITS/TRAYS/PACK) ×3 IMPLANT
SET IRRIG TUBING LAPAROSCOPIC (IRRIGATION / IRRIGATOR) ×3 IMPLANT
SET TUBE SMOKE EVAC HIGH FLOW (TUBING) ×3 IMPLANT
SLEEVE ENDOPATH XCEL 5M (ENDOMECHANICALS) ×3 IMPLANT
SPECIMEN JAR SMALL (MISCELLANEOUS) ×3 IMPLANT
SPONGE GAUZE 2X2 STER 10/PKG (GAUZE/BANDAGES/DRESSINGS) ×2
STRIP CLOSURE SKIN 1/2X4 (GAUZE/BANDAGES/DRESSINGS) ×2 IMPLANT
SUT MNCRL AB 4-0 PS2 18 (SUTURE) ×3 IMPLANT
TOWEL OR 17X24 6PK STRL BLUE (TOWEL DISPOSABLE) ×3 IMPLANT
TOWEL OR 17X26 10 PK STRL BLUE (TOWEL DISPOSABLE) ×3 IMPLANT
TRAY LAPAROSCOPIC MC (CUSTOM PROCEDURE TRAY) ×3 IMPLANT
TROCAR XCEL BLUNT TIP 100MML (ENDOMECHANICALS) ×3 IMPLANT
TROCAR XCEL NON-BLD 11X100MML (ENDOMECHANICALS) ×3 IMPLANT
TROCAR XCEL NON-BLD 5MMX100MML (ENDOMECHANICALS) ×3 IMPLANT
WATER STERILE IRR 1000ML POUR (IV SOLUTION) ×3 IMPLANT

## 2018-10-02 NOTE — Anesthesia Postprocedure Evaluation (Signed)
Anesthesia Post Note  Patient: Valerie Obrien  Procedure(s) Performed: LAPAROSCOPIC CHOLECYSTECTOMY WITH INTRAOPERATIVE CHOLANGIOGRAM (N/A Abdomen)     Patient location during evaluation: PACU Anesthesia Type: General Level of consciousness: awake and alert Pain management: pain level controlled Vital Signs Assessment: post-procedure vital signs reviewed and stable Respiratory status: spontaneous breathing, nonlabored ventilation and respiratory function stable Cardiovascular status: blood pressure returned to baseline and stable Postop Assessment: no apparent nausea or vomiting Anesthetic complications: no    Last Vitals:  Vitals:   10/02/18 1207 10/02/18 1225  BP:  132/66  Pulse: 68 66  Resp: 12   Temp:  (!) 36.4 C  SpO2: 98% 98%    Last Pain:  Vitals:   10/02/18 1225  TempSrc: Oral  PainSc:                  Lidia Collum

## 2018-10-02 NOTE — Progress Notes (Signed)
Patient has developed a reddened rash on her right upper back, she was receiving IV Zosyn at the time.She denies any difficulty breathing or any other symptoms at this time

## 2018-10-02 NOTE — Progress Notes (Signed)
[facility] Progress Note     Subjective: CC: choledocholithiasis Patient reports pain is better today. Denies nausea. Discussed laparoscopic cholecystectomy and patient agreeable to proceed.   Objective: Vital signs in last 24 hours: Temp:  [98 F (36.7 C)-98.1 F (36.7 C)] 98 F (36.7 C) (04/23 0426) Pulse Rate:  [53-77] 64 (04/23 0602) Resp:  [14-18] 16 (04/23 0426) BP: (123-224)/(57-114) 123/57 (04/23 0602) SpO2:  [96 %-100 %] 98 % (04/23 0426) Weight:  [68 kg-69.9 kg] 69.9 kg (04/23 0500) Last BM Date: 09/30/18  Intake/Output from previous day: No intake/output data recorded. Intake/Output this shift: No intake/output data recorded.  PE: Gen:  Alert, NAD, pleasant Card:  Regular rate and rhythm Pulm:  Normal effort, clear to auscultation bilaterally Abd: Soft, mildly TTP in RUQ, +BS, no distention  Skin: warm and dry, rash on back with erythema - looks like contact dermatitis Psych: A&Ox3   Lab Results:  Recent Labs    10/01/18 1742 10/02/18 0410  WBC 7.9 6.0  HGB 15.7* 15.6*  HCT 46.8* 46.9*  PLT 131* 208   BMET Recent Labs    10/01/18 1742 10/02/18 0641  NA 139 141  K 4.2 3.3*  CL 104 109  CO2 22 23  GLUCOSE 115* 140*  BUN 14 10  CREATININE 0.95 0.86  CALCIUM 9.8 8.8*   PT/INR No results for input(s): LABPROT, INR in the last 72 hours. CMP     Component Value Date/Time   NA 141 10/02/2018 0641   K 3.3 (L) 10/02/2018 0641   CL 109 10/02/2018 0641   CO2 23 10/02/2018 0641   GLUCOSE 140 (H) 10/02/2018 0641   BUN 10 10/02/2018 0641   CREATININE 0.86 10/02/2018 0641   CALCIUM 8.8 (L) 10/02/2018 0641   PROT 6.3 (L) 10/02/2018 0641   ALBUMIN 3.5 10/02/2018 0641   AST 180 (H) 10/02/2018 0641   ALT 219 (H) 10/02/2018 0641   ALKPHOS 137 (H) 10/02/2018 0641   BILITOT 1.1 10/02/2018 0641   GFRNONAA >60 10/02/2018 0641   GFRAA >60 10/02/2018 0641   Lipase     Component Value Date/Time   LIPASE 34 10/01/2018 1742        Studies/Results: US Abdomen Limited Ruq  Result Date: 10/01/2018 CLINICAL DATA:  Acute abdominal pain since earlier today. Nausea and vomiting. Elevated white count. EXAM: ULTRASOUND ABDOMEN LIMITED RIGHT UPPER QUADRANT COMPARISON:  None. FINDINGS: Gallbladder: Numerous gallstones, with gallbladder wall thickening. Gallbladder wall thickness 8 mm. Negative sonographic Murphy's sign. Largest single dimension calculus 4 mm. Common bile duct: Diameter: 3.4 mm. Liver: No focal lesion identified. Within normal limits in parenchymal echogenicity. Portal vein is patent on color Doppler imaging with normal direction of blood flow towards the liver. IMPRESSION: Cholelithiasis. Thick-walled gallbladder (8 mm) could represent acute or chronic cholecystitis, although no sonographic Murphy's sign is present. Furthermore, no pericholecystic fluid or biliary ductal dilatation. Correlate clinically. Electronically Signed   By: Staci Righter M.D.   On: 10/01/2018 21:41    Anti-infectives: Anti-infectives (From admission, onward)   Start     Dose/Rate Route Frequency Ordered Stop   10/02/18 1000  ciprofloxacin (CIPRO) IVPB 400 mg     400 mg 200 mL/hr over 60 Minutes Intravenous Every 12 hours 10/02/18 0621     10/02/18 0630  metroNIDAZOLE (FLAGYL) IVPB 500 mg     500 mg 100 mL/hr over 60 Minutes Intravenous Every 8 hours 10/02/18 0621     10/01/18 2215  piperacillin-tazobactam (ZOSYN) IVPB 4.5 g  Status:  Discontinued     4.5 g 200 mL/hr over 30 Minutes Intravenous  Once 10/01/18 2202 10/01/18 2206   10/01/18 2215  piperacillin-tazobactam (ZOSYN) IVPB 3.375 g  Status:  Discontinued     3.375 g 12.5 mL/hr over 240 Minutes Intravenous Every 8 hours 10/01/18 2206 10/02/18 0618       Assessment/Plan HTN - control improving HLD Hypothyroidism Mild hypokalemia - K 3.3 this AM, replacing IV  Probable Choledocholithiasis, Possible Cholecystitis - LFTs trending down this AM and pain improved - to  OR for laparoscopic cholecystectomy with IOC  Rash - looks more like contact dermatitis to me but abx were changed this AM  FEN: NPO, IVF VTE: SCDs ID: Zosyn 4/22>4/23; cipro/flagyl 4/23>>  LOS: 1 day    Brigid Re , [facility] 10/02/2018, 8:08 AM Pager: [place]: [phone]

## 2018-10-02 NOTE — Transfer of Care (Signed)
Immediate Anesthesia Transfer of Care Note  Patient: Valerie Obrien  Procedure(s) Performed: LAPAROSCOPIC CHOLECYSTECTOMY WITH INTRAOPERATIVE CHOLANGIOGRAM (N/A Abdomen)  Patient Location: PACU  Anesthesia Type:General  Level of Consciousness: drowsy and patient cooperative  Airway & Oxygen Therapy: Patient Spontanous Breathing and Patient connected to nasal cannula oxygen  Post-op Assessment: Report given to RN, Post -op Vital signs reviewed and stable and Patient moving all extremities  Post vital signs: Reviewed and stable  Last Vitals:  Vitals Value Taken Time  BP 125/62 10/02/2018 11:01 AM  Temp    Pulse 77 10/02/2018 11:01 AM  Resp 13 10/02/2018 11:01 AM  SpO2 97 % 10/02/2018 11:01 AM  Vitals shown include unvalidated device data.  Last Pain:  Vitals:   10/02/18 0823  TempSrc:   PainSc: 0-No pain         Complications: No apparent anesthesia complications

## 2018-10-02 NOTE — Op Note (Signed)
Laparoscopic Cholecystectomy with IOC Procedure Note  Indications: This patient presents with symptomatic gallbladder disease and will undergo laparoscopic cholecystectomy.  Pre-operative Diagnosis: Calculus of gallbladder and bile duct with acute cholecystitis, without mention of obstruction  Post-operative Diagnosis: Same  Surgeon: Maia Petties   Assistants: Dr.Doug Ninfa Linden  Anesthesia: General endotracheal anesthesia  ASA Class: 2  Procedure Details  The patient was seen again in the Holding Room. The risks, benefits, complications, treatment options, and expected outcomes were discussed with the patient. The possibilities of reaction to medication, pulmonary aspiration, perforation of viscus, bleeding, recurrent infection, finding a normal gallbladder, the need for additional procedures, failure to diagnose a condition, the possible need to convert to an open procedure, and creating a complication requiring transfusion or operation were discussed with the patient. The likelihood of improving the patient's symptoms with return to their baseline status is good.  The patient and/or family concurred with the proposed plan, giving informed consent. The site of surgery properly noted. The patient was taken to Operating Room, identified as [name] and the procedure verified as Laparoscopic Cholecystectomy with Intraoperative Cholangiogram. A Time Out was held and the above information confirmed.  Prior to the induction of general anesthesia, antibiotic prophylaxis was administered. General endotracheal anesthesia was then administered and tolerated well. After the induction, the abdomen was prepped with Chloraprep and draped in the sterile fashion. The patient was positioned in the supine position.  Local anesthetic agent was injected into the skin near the umbilicus and an incision made. We dissected down to the abdominal fascia with blunt dissection.  The fascia was incised vertically  and we entered the peritoneal cavity bluntly.  A pursestring suture of 0-Vicryl was placed around the fascial opening.  The Hasson cannula was inserted and secured with the stay suture.  Pneumoperitoneum was then created with CO2 and tolerated well without any adverse changes in the patient's vital signs. An 11-mm port was placed in the subxiphoid position.  Two 5-mm ports were placed in the right upper quadrant. All skin incisions were infiltrated with a local anesthetic agent before making the incision and placing the trocars.   We positioned the patient in reverse Trendelenburg, tilted slightly to the patient's left.  The gallbladder was identified, the fundus grasped and retracted cephalad. The gallbladder is distended and mildly thickened.  There are dense omental adhesions to the gallbladder and liver.  Adhesions were lysed bluntly and with the electrocautery where indicated, taking care not to injure any adjacent organs or viscus. The infundibulum was grasped and retracted laterally, exposing the peritoneum overlying the triangle of Calot. This was then divided and exposed in a blunt fashion. A critical view of the cystic duct and cystic artery was obtained.  The cystic duct was clearly identified and bluntly dissected circumferentially. The cystic duct was ligated with a clip distally.   An incision was made in the cystic duct and multiple small stones were expressed from the cystic duct.  The [facility] cholangiogram catheter introduced. The catheter was secured using a clip. A cholangiogram was then obtained which showed good visualization of the distal and proximal biliary tree with no sign of filling defects or obstruction.  Contrast flowed easily into the duodenum. The catheter was then removed.   The cystic duct was then ligated with clips and divided. The cystic artery was identified, dissected free, ligated with clips and divided as well.   The gallbladder was dissected from the liver bed in  retrograde fashion with the  electrocautery. The gallbladder was removed and placed in an Endocatch sac. The liver bed was irrigated and inspected. Hemostasis was achieved with the electrocautery and Surgicel SNOW. Copious irrigation was utilized and was repeatedly aspirated until clear.  The gallbladder and Endocatch sac were then removed through the umbilical port site.  The pursestring suture was used to close the umbilical fascia.    We again inspected the right upper quadrant for hemostasis.  Pneumoperitoneum was released as we removed the trocars.  4-0 Monocryl was used to close the skin.   Benzoin, steri-strips, and clean dressings were applied. The patient was then extubated and brought to the recovery room in stable condition. Instrument, sponge, and needle counts were correct at closure and at the conclusion of the case.   Findings: Cholecystitis with Cholelithiasis; small stones in cystic duct; IOC clear  Estimated Blood Loss: less than 50 mL         Drains: none         Specimens: Gallbladder           Complications: None; patient tolerated the procedure well.         Disposition: PACU - hemodynamically stable.         Condition: stable  Valerie Obrien. Georgette Dover, MD, [facility]  General/ Trauma Surgery Beeper [phone]  10/02/2018 11:10 AM

## 2018-10-02 NOTE — Anesthesia Procedure Notes (Signed)
Procedure Name: Intubation Date/Time: 10/02/2018 9:42 AM Performed by: Moshe Salisbury, CRNA Pre-anesthesia Checklist: Patient identified, Emergency Drugs available, Suction available and Patient being monitored Patient Re-evaluated:Patient Re-evaluated prior to induction Oxygen Delivery Method: Circle System Utilized Preoxygenation: Pre-oxygenation with 100% oxygen Induction Type: IV induction and Rapid sequence Laryngoscope Size: Mac and 3 Grade View: Grade I Tube type: Oral Tube size: 7.5 mm Number of attempts: 1 Airway Equipment and Method: Stylet Placement Confirmation: ETT inserted through vocal cords under direct vision,  positive ETCO2 and breath sounds checked- equal and bilateral Secured at: 21 cm Tube secured with: Tape Dental Injury: Teeth and Oropharynx as per pre-operative assessment

## 2018-10-02 NOTE — Progress Notes (Signed)
History and Physical    Valerie Obrien WER:[id number] DOB: [birth year] DOA: 10/01/2018  PCP: Shon Baton, MD  Patient coming from: Home.  Chief Complaint: Abdominal pain.  HPI: Valerie Obrien is a 73 y.o. female with history of hypertension and hypothyroidism was referred to the ER after patient labs came abnormal done by her primary care physician.  Patient states this morning around 11:00 patient started developing epigastric and right upper quadrant pain following which patient had at least 5 episodes of vomiting.  No blood in the vomitus.  Pain lasted for almost 2 to 3 hours.  Had gone to her PCP and had labs done which showed elevated LFTs and was advised to come to the ER.  By then patient's pain is resolved.  No diarrhea.  Denies any chest pain productive cough fever or chills.  No contact with any COVID-19 patients. In the ER patient had a sonogram of the abdomen done which shows gallstones with thickened gallbladder negative Murphy sign.  LFTs were elevated at AST of 448 ALT of 252 bilirubin 1.8.  EKG was showing normal sinus rhythm.  On-call general surgeon Dr. Dema Severin has been consulted and patient admitted for further management.  At admission patient blood pressure was markedly elevated at systolic more than 195.  Review of Systems: As per HPI, rest all negative.   Past Medical History:  Diagnosis Date  . High cholesterol   . Hypertension   . Thyroid disease    hypo    Past Surgical History:  Procedure Laterality Date  . BREAST EXCISIONAL BIOPSY Left   . CATARACT EXTRACTION     left eye     reports that she has never smoked. She has never used smokeless tobacco. She reports previous alcohol use. She reports that she does not use drugs.  Allergies  Allergen Reactions  . Sulfonamide Derivatives Other (See Comments)  . Zosyn [Piperacillin Sod-Tazobactam So] Rash    Family History  Family history unknown: Yes    Prior to Admission medications   Medication Sig Start Date  End Date Taking? Authorizing Provider  Calcium Carbonate-Vitamin D (CALTRATE 600+D PO) Take 2 tablets by mouth daily.    [provider]  citalopram (CELEXA) 20 MG tablet daily. as directed 01/24/15   [provider]  diclofenac sodium (VOLTAREN) 1 % GEL APPLY 2-4 GRAM TO AFFECTED AREA 4 TIMES DAILY AS NEEDED 01/27/15   [provider]  DIOVAN 80 MG tablet TAKE 1 TABLET EVERY DAY AS DIRECTED 01/24/15   [provider]  omeprazole (PRILOSEC) 10 MG capsule Take 10 mg by mouth daily.    [provider]  SYNTHROID 88 MCG tablet Take 88 mcg by mouth daily. 02/01/15   [provider]    Physical Exam: Vitals:   10/02/18 1145 10/02/18 1200 10/02/18 1207 10/02/18 1225  BP: 130/70 133/71  132/66  Pulse: 66 64 68 66  Resp: 14 11 12    Temp:  (!) 97.5 F (36.4 C)  (!) 97.5 F (36.4 C)  TempSrc:    Oral  SpO2: 98% 98% 98% 98%  Weight:      Height:          Constitutional: Moderately built and nourished. Vitals:   10/02/18 1145 10/02/18 1200 10/02/18 1207 10/02/18 1225  BP: 130/70 133/71  132/66  Pulse: 66 64 68 66  Resp: 14 11 12    Temp:  (!) 97.5 F (36.4 C)  (!) 97.5 F (36.4 C)  TempSrc:  Oral  SpO2: 98% 98% 98% 98%  Weight:      Height:       General:  Pleasantly resting in bed, No acute distress. HEENT:  Normocephalic atraumatic.  Sclerae nonicteric, noninjected.  Extraocular movements intact bilaterally. Neck:  Without mass or deformity.  Trachea is midline. Lungs:  Clear to auscultate bilaterally without rhonchi, wheeze, or rales. Heart:  Regular rate and rhythm.  Without murmurs, rubs, or gallops. Abdomen:  Soft, moderately tender, nondistended.  Without guarding or rebound.  Surgical bandages clean dry intact Extremities: Without cyanosis, clubbing, edema, or obvious deformity. Vascular:  Dorsalis pedis and posterior tibial pulses palpable bilaterally. Skin:  Warm and dry, no erythema, no ulcerations.    Labs on  Admission: I have personally reviewed following labs and imaging studies  CBC: Recent Labs  Lab 10/01/18 1742 10/02/18 0410  WBC 7.9 6.0  NEUTROABS 5.8 3.9  HGB 15.7* 15.6*  HCT 46.8* 46.9*  MCV 84.9 84.7  PLT 131* 701   Basic Metabolic Panel: Recent Labs  Lab 10/01/18 1742 10/02/18 0641  NA 139 141  K 4.2 3.3*  CL 104 109  CO2 22 23  GLUCOSE 115* 140*  BUN 14 10  CREATININE 0.95 0.86  CALCIUM 9.8 8.8*   GFR: Estimated Creatinine Clearance: 57.1 mL/min (by C-G formula based on SCr of 0.86 mg/dL). Liver Function Tests: Recent Labs  Lab 10/01/18 1742 10/02/18 0641  AST 448* 180*  ALT 252* 219*  ALKPHOS 133* 137*  BILITOT 1.8* 1.1  PROT 7.0 6.3*  ALBUMIN 4.1 3.5   Recent Labs  Lab 10/01/18 1742  LIPASE 34   No results for input(s): AMMONIA in the last 168 hours. Coagulation Profile: No results for input(s): INR, PROTIME in the last 168 hours. Cardiac Enzymes: No results for input(s): CKTOTAL, CKMB, CKMBINDEX, TROPONINI in the last 168 hours. BNP (last 3 results) No results for input(s): PROBNP in the last 8760 hours. HbA1C: No results for input(s): HGBA1C in the last 72 hours. CBG: No results for input(s): GLUCAP in the last 168 hours. Lipid Profile: No results for input(s): CHOL, HDL, LDLCALC, TRIG, CHOLHDL, LDLDIRECT in the last 72 hours. Thyroid Function Tests: No results for input(s): TSH, T4TOTAL, FREET4, T3FREE, THYROIDAB in the last 72 hours. Anemia Panel: No results for input(s): VITAMINB12, FOLATE, FERRITIN, TIBC, IRON, RETICCTPCT in the last 72 hours. Urine analysis:    Component Value Date/Time   COLORURINE YELLOW 10/01/2018 2022   APPEARANCEUR CLEAR 10/01/2018 2022   LABSPEC 1.015 10/01/2018 2022   PHURINE 8.0 10/01/2018 2022   GLUCOSEU NEGATIVE 10/01/2018 2022   HGBUR NEGATIVE 10/01/2018 2022   BILIRUBINUR NEGATIVE 10/01/2018 2022   KETONESUR 20 (A) 10/01/2018 2022   PROTEINUR NEGATIVE 10/01/2018 2022   NITRITE NEGATIVE 10/01/2018  2022   LEUKOCYTESUR NEGATIVE 10/01/2018 2022   Sepsis Labs: @LABRCNTIP (procalcitonin:4,lacticidven:4) ) Recent Results (from the past 240 hour(s))  Surgical pcr screen     Status: None   Collection Time: 10/02/18 12:30 AM  Result Value Ref Range Status   MRSA, PCR NEGATIVE NEGATIVE Final   Staphylococcus aureus NEGATIVE NEGATIVE Final    Comment: (NOTE) The Xpert SA Assay (FDA approved for NASAL specimens in patients 42 years of age and older), is one component of a comprehensive surveillance program. It is not intended to diagnose infection nor to guide or monitor treatment. Performed at [facility], [place] [address], [place], [place] [address]      Radiological Exams on Admission: Dg Cholangiogram Operative  Result Date: 10/02/2018 CLINICAL DATA:  73 year old female undergoing laparoscopic cholecystectomy with intraoperative cholangiogram EXAM: INTRAOPERATIVE CHOLANGIOGRAM TECHNIQUE: Cholangiographic images from the C-arm fluoroscopic device were submitted for interpretation post-operatively. Please see the procedural report for the amount of contrast and the fluoroscopy time utilized. COMPARISON:  Right upper quadrant ultrasound 10/01/2018 FINDINGS: Submitted images demonstrate cannulation of the cystic duct remanent and intraoperative cholangiogram. No evidence of biliary ductal dilatation, stenosis, stricture or choledocholithiasis. Contrast material passes freely through the ampulla and into the duodenum. IMPRESSION: Negative intraoperative cholangiogram. Electronically Signed   By: Jacqulynn Cadet M.D.   On: 10/02/2018 10:51   US Abdomen Limited Ruq  Result Date: 10/01/2018 CLINICAL DATA:  Acute abdominal pain since earlier today. Nausea and vomiting. Elevated white count. EXAM: ULTRASOUND ABDOMEN LIMITED RIGHT UPPER QUADRANT COMPARISON:  None. FINDINGS: Gallbladder: Numerous gallstones, with gallbladder wall thickening. Gallbladder wall thickness 8 mm. Negative sonographic  Murphy's sign. Largest single dimension calculus 4 mm. Common bile duct: Diameter: 3.4 mm. Liver: No focal lesion identified. Within normal limits in parenchymal echogenicity. Portal vein is patent on color Doppler imaging with normal direction of blood flow towards the liver. IMPRESSION: Cholelithiasis. Thick-walled gallbladder (8 mm) could represent acute or chronic cholecystitis, although no sonographic Murphy's sign is present. Furthermore, no pericholecystic fluid or biliary ductal dilatation. Correlate clinically. Electronically Signed   By: Staci Righter M.D.   On: 10/01/2018 21:41    EKG: Independently reviewed.  Normal sinus rhythm.  Assessment/Plan Principal Problem:   Acute cholecystitis Active Problems:   Hypertensive urgency   Hypothyroidism    Acute cholecystitis with elevated LFTs, POA -Surgery following, laparoscopic cholecystectomy performed this morning -patient tolerated well -Continue IV fluids IV pain medications until cleared for increased p.o. intake -Got Zosyn overnight w/ ? Rash - hold further antibiotics given obstructive process (cipro/flagyl ordered then DC early this morning preoperatively) - less likely infectious process - will follow for fever/leukocytosis/elevated CRP -AST, ALT, T bili downtrending appropriately IV fluids -Pain well controlled -Continue to follow morning labs -Advance diet per surgery, currently on clears  Intractable nausea vomiting and abdominal pain secondary to above, resolved -Improving with supportive care; continue IV fluids now postoperatively - currently on clears -Advance diet per surgical recommendations  Hypertensive urgency, in the setting of above, improving -Likely in the setting of above; continue PRN IV hydralazine and follow clinically -Much more well controlled postoperatively  Rash overnight, questionably secondary to Zosyn  -Single dose of Zosyn was given overnight, reported rash by staff -improved with supportive  care -Transition to Cipro and Flagyl per discussion with pharmacy at that time per note - these were held this morning - hold off on ongoing abx as above -Continue to follow along clinically  Hypothyroidism on IV Synthroid replacement until patient can take orally. Depression currently being weaned off citalopram as per the patient -we will need to verify dosing   DVT prophylaxis: SCDs -continue to hold chemical prophylaxis perioperatively Code Status: Full code. Family Communication: None present, attempt to call the family again this afternoon. Disposition Plan: Currently inpatient, pending recovery from surgery, ability to tolerate p.o. and control pain with p.o. medications disposition in the next 24 to 48 hours would not be unreasonable. Likely dispo home pending needs once ambulatory post-op Consults called: General surgery. Admission status: Inpatient.   Little Ishikawa MD [facility]  If 7PM-7AM, please contact night-coverage [email or web address] Password [email or web address]  10/02/2018, 1:02 PM

## 2018-10-02 NOTE — Anesthesia Preprocedure Evaluation (Addendum)
Anesthesia Evaluation  Patient identified by MRN, date of birth, ID band Patient awake    Reviewed: Allergy & Precautions, NPO status , Patient's Chart, lab work & pertinent test results  History of Anesthesia Complications Negative for: history of anesthetic complications  Airway Mallampati: II  TM Distance: >3 FB Neck ROM: Full  Mouth opening: Limited Mouth Opening  Dental no notable dental hx. (+) Teeth Intact   Pulmonary neg pulmonary ROS,    Pulmonary exam normal        Cardiovascular hypertension, Normal cardiovascular exam     Neuro/Psych negative neurological ROS  negative psych ROS   GI/Hepatic Neg liver ROS, GERD  ,  Endo/Other  Hypothyroidism   Renal/GU negative Renal ROS  negative genitourinary   Musculoskeletal negative musculoskeletal ROS (+)   Abdominal   Peds  Hematology negative hematology ROS (+)   Anesthesia Other Findings   Reproductive/Obstetrics                            Anesthesia Physical Anesthesia Plan  ASA: II  Anesthesia Plan: General   Post-op Pain Management:    Induction: Intravenous and Rapid sequence  PONV Risk Score and Plan: 3 and Ondansetron, Dexamethasone and Treatment may vary due to age or medical condition  Airway Management Planned: Oral ETT  Additional Equipment: None  Intra-op Plan:   Post-operative Plan: Extubation in OR  Informed Consent: I have reviewed the patients History and Physical, chart, labs and discussed the procedure including the risks, benefits and alternatives for the proposed anesthesia with the patient or authorized representative who has indicated his/her understanding and acceptance.     Dental advisory given  Plan Discussed with:   Anesthesia Plan Comments:         Anesthesia Quick Evaluation

## 2018-10-03 ENCOUNTER — Encounter (HOSPITAL_COMMUNITY): Payer: Self-pay | Admitting: Surgery

## 2018-10-03 DIAGNOSIS — K810 Acute cholecystitis: Secondary | ICD-10-CM | POA: Diagnosis not present

## 2018-10-03 NOTE — Progress Notes (Signed)
[facility] Progress Note  1 Day Post-Op  Subjective: CC:  Patient ambulating this AM. Feeling better than before surgery. Mild soreness. A little nausea but tolerating liquids.   Objective: Vital signs in last 24 hours: Temp:  [97.5 F (36.4 C)-98.6 F (37 C)] 98.5 F (36.9 C) (04/24 0519) Pulse Rate:  [64-79] 74 (04/24 0519) Resp:  [11-20] 14 (04/24 0025) BP: (123-169)/(60-78) 166/77 (04/24 0519) SpO2:  [95 %-99 %] 98 % (04/24 0519) Weight:  [69.9 kg] 69.9 kg (04/23 0918) Last BM Date: 10/01/18  Intake/Output from previous day: 04/23 0701 - 04/24 0700 In: 3057.6 [P.O.:240; I.V.:2517.6; IV Piggyback:200] Out: 75 [Blood:75] Intake/Output this shift: No intake/output data recorded.  PE: Gen:  Alert, NAD, pleasant Card:  Regular rate and rhythm Pulm:  Normal effort, clear to auscultation bilaterally Abd: Soft, appropriately tender, non-distended, bowel sounds present in all 4 quadrants, no HSM, incisions C/D/I Skin: warm and dry, no rashes  Psych: A&Ox3   Lab Results:  Recent Labs    10/02/18 0410 10/02/18 1428  WBC 6.0 9.2  HGB 15.6* 14.6  HCT 46.9* 44.0  PLT 208 228   BMET Recent Labs    10/01/18 1742 10/02/18 0641  NA 139 141  K 4.2 3.3*  CL 104 109  CO2 22 23  GLUCOSE 115* 140*  BUN 14 10  CREATININE 0.95 0.86  CALCIUM 9.8 8.8*   PT/INR No results for input(s): LABPROT, INR in the last 72 hours. CMP     Component Value Date/Time   NA 141 10/02/2018 0641   K 3.3 (L) 10/02/2018 0641   CL 109 10/02/2018 0641   CO2 23 10/02/2018 0641   GLUCOSE 140 (H) 10/02/2018 0641   BUN 10 10/02/2018 0641   CREATININE 0.86 10/02/2018 0641   CALCIUM 8.8 (L) 10/02/2018 0641   PROT 6.3 (L) 10/02/2018 0641   ALBUMIN 3.5 10/02/2018 0641   AST 180 (H) 10/02/2018 0641   ALT 219 (H) 10/02/2018 0641   ALKPHOS 137 (H) 10/02/2018 0641   BILITOT 1.1 10/02/2018 0641   GFRNONAA >60 10/02/2018 0641   GFRAA >60 10/02/2018 0641   Lipase     Component  Value Date/Time   LIPASE 34 10/01/2018 1742       Studies/Results: Dg Cholangiogram Operative  Result Date: 10/02/2018 CLINICAL DATA:  73 year old female undergoing laparoscopic cholecystectomy with intraoperative cholangiogram EXAM: INTRAOPERATIVE CHOLANGIOGRAM TECHNIQUE: Cholangiographic images from the C-arm fluoroscopic device were submitted for interpretation post-operatively. Please see the procedural report for the amount of contrast and the fluoroscopy time utilized. COMPARISON:  Right upper quadrant ultrasound 10/01/2018 FINDINGS: Submitted images demonstrate cannulation of the cystic duct remanent and intraoperative cholangiogram. No evidence of biliary ductal dilatation, stenosis, stricture or choledocholithiasis. Contrast material passes freely through the ampulla and into the duodenum. IMPRESSION: Negative intraoperative cholangiogram. Electronically Signed   By: Jacqulynn Cadet M.D.   On: 10/02/2018 10:51   US Abdomen Limited Ruq  Result Date: 10/01/2018 CLINICAL DATA:  Acute abdominal pain since earlier today. Nausea and vomiting. Elevated white count. EXAM: ULTRASOUND ABDOMEN LIMITED RIGHT UPPER QUADRANT COMPARISON:  None. FINDINGS: Gallbladder: Numerous gallstones, with gallbladder wall thickening. Gallbladder wall thickness 8 mm. Negative sonographic Murphy's sign. Largest single dimension calculus 4 mm. Common bile duct: Diameter: 3.4 mm. Liver: No focal lesion identified. Within normal limits in parenchymal echogenicity. Portal vein is patent on color Doppler imaging with normal direction of blood flow towards the liver. IMPRESSION: Cholelithiasis. Thick-walled gallbladder (8 mm) could represent acute or chronic cholecystitis,  although no sonographic Murphy's sign is present. Furthermore, no pericholecystic fluid or biliary ductal dilatation. Correlate clinically. Electronically Signed   By: Staci Righter M.D.   On: 10/01/2018 21:41    Anti-infectives: Anti-infectives (From  admission, onward)   Start     Dose/Rate Route Frequency Ordered Stop   10/02/18 1000  ciprofloxacin (CIPRO) IVPB 400 mg  Status:  Discontinued     400 mg 200 mL/hr over 60 Minutes Intravenous Every 12 hours 10/02/18 0621 10/02/18 1226   10/02/18 0630  metroNIDAZOLE (FLAGYL) IVPB 500 mg  Status:  Discontinued     500 mg 100 mL/hr over 60 Minutes Intravenous Every 8 hours 10/02/18 0621 10/02/18 1226   10/01/18 2215  piperacillin-tazobactam (ZOSYN) IVPB 4.5 g  Status:  Discontinued     4.5 g 200 mL/hr over 30 Minutes Intravenous  Once 10/01/18 2202 10/01/18 2206   10/01/18 2215  piperacillin-tazobactam (ZOSYN) IVPB 3.375 g  Status:  Discontinued     3.375 g 12.5 mL/hr over 240 Minutes Intravenous Every 8 hours 10/01/18 2206 10/02/18 0618       Assessment/Plan HTN - control improving HLD Hypothyroidism  Acute cholecystitis - s/p lap chole with IOC 10/03/18 Dr. Georgette Dover - POD#1 - pain well controlled - tolerating liquids - ok to discharge home from a surgery standpoint. F/U arranged, instructions in chart and Rx for pain medication sent to pharmacy   FEN: reg diet VTE: SCDs ID: Zosyn 4/22>4/23; cipro/flagyl 4/23  LOS: 1 day    Brigid Re , [facility] 10/03/2018, 8:36 AM Pager: [phone] Consults: [phone]

## 2018-10-03 NOTE — Discharge Instructions (Signed)
Remove surgical dressings 4/25 or post-op day 2. You may shower and allow soap and water to run over incisions.    Managing Your Pain After Surgery Without Opioids    Thank you for participating in our program to help patients manage their pain after surgery without opioids. This is part of our effort to provide you with the best care possible, without exposing you or your family to the risk that opioids pose.  What pain can I expect after surgery? You can expect to have some pain after surgery. This is normal. The pain is typically worse the day after surgery, and quickly begins to get better. Many studies have found that many patients are able to manage their pain after surgery with Over-the-Counter (OTC) medications such as Tylenol and Motrin. If you have a condition that does not allow you to take Tylenol or Motrin, notify your surgical team.  How will I manage my pain? The best strategy for controlling your pain after surgery is around the clock pain control with Tylenol (acetaminophen) and Motrin (ibuprofen or Advil). Alternating these medications with each other allows you to maximize your pain control. In addition to Tylenol and Motrin, you can use heating pads or ice packs on your incisions to help reduce your pain.  How will I alternate your regular strength over-the-counter pain medication? You will take a dose of pain medication every three hours. ; Start by taking 650 mg of Tylenol (2 pills of 325 mg) ; 3 hours later take 600 mg of Motrin (3 pills of 200 mg) ; 3 hours after taking the Motrin take 650 mg of Tylenol ; 3 hours after that take 600 mg of Motrin.   - 1 -  See example - if your first dose of Tylenol is at 12:00 PM   12:00 PM Tylenol 650 mg (2 pills of 325 mg)  3:00 PM Motrin 600 mg (3 pills of 200 mg)  6:00 PM Tylenol 650 mg (2 pills of 325 mg)  9:00 PM Motrin 600 mg (3 pills of 200 mg)  Continue alternating every 3 hours   We recommend that you follow this  schedule around-the-clock for at least 3 days after surgery, or until you feel that it is no longer needed. Use the table on the last page of this handout to keep track of the medications you are taking. Important: Do not take more than 3000mg  of Tylenol or 3200mg  of Motrin in a 24-hour period. Do not take ibuprofen/Motrin if you have a history of bleeding stomach ulcers, severe kidney disease, &/or actively taking a blood thinner  What if I still have pain? If you have pain that is not controlled with the over-the-counter pain medications (Tylenol and Motrin or Advil) you might have what we call breakthrough pain. You will receive a prescription for a small amount of an opioid pain medication such as Oxycodone, Tramadol, or Tylenol with Codeine. Use these opioid pills in the first 24 hours after surgery if you have breakthrough pain. Do not take more than 1 pill every 4-6 hours.  If you still have uncontrolled pain after using all opioid pills, don't hesitate to call our staff using the number provided. We will help make sure you are managing your pain in the best way possible, and if necessary, we can provide a prescription for additional pain medication.   Day 1    Time  Name of Medication Number of pills taken  Amount of Acetaminophen  Pain Level  Comments  AM PM       AM PM       AM PM       AM PM       AM PM       AM PM       AM PM       AM PM       Total Daily amount of Acetaminophen Do not take more than  3,000 mg per day      Day 2    Time  Name of Medication Number of pills taken  Amount of Acetaminophen  Pain Level   Comments  AM PM       AM PM       AM PM       AM PM       AM PM       AM PM       AM PM       AM PM       Total Daily amount of Acetaminophen Do not take more than  3,000 mg per day      Day 3    Time  Name of Medication Number of pills taken  Amount of Acetaminophen  Pain Level   Comments  AM PM       AM PM       AM PM         AM PM          AM PM       AM PM       AM PM       AM PM       Total Daily amount of Acetaminophen Do not take more than  3,000 mg per day      Day 4    Time  Name of Medication Number of pills taken  Amount of Acetaminophen  Pain Level   Comments  AM PM       AM PM       AM PM       AM PM       AM PM       AM PM       AM PM       AM PM       Total Daily amount of Acetaminophen Do not take more than  3,000 mg per day      Day 5    Time  Name of Medication Number of pills taken  Amount of Acetaminophen  Pain Level   Comments  AM PM       AM PM       AM PM       AM PM       AM PM       AM PM       AM PM       AM PM       Total Daily amount of Acetaminophen Do not take more than  3,000 mg per day       Day 6    Time  Name of Medication Number of pills taken  Amount of Acetaminophen  Pain Level  Comments  AM PM       AM PM       AM PM       AM PM       AM PM       AM PM  AM PM       AM PM       Total Daily amount of Acetaminophen Do not take more than  3,000 mg per day      Day 7    Time  Name of Medication Number of pills taken  Amount of Acetaminophen  Pain Level   Comments  AM PM       AM PM       AM PM       AM PM       AM PM       AM PM       AM PM       AM PM       Total Daily amount of Acetaminophen Do not take more than  3,000 mg per day        For additional information about how and where to safely dispose of unused opioid medications - [employer]  Disclaimer: This document contains information and/or instructional materials adapted from [facility] for the typical patient with your condition. It does not replace medical advice from your health care provider because your experience may differ from that of the typical patient. Talk to your health care provider if you have any questions about this document, your condition or your treatment plan. Adapted from [facility] LAPAROSCOPIC SURGERY: POST OP INSTRUCTIONS Always review your discharge instruction sheet given to you by the facility where your surgery was performed. IF YOU HAVE DISABILITY OR FAMILY LEAVE FORMS, YOU MUST BRING THEM TO THE OFFICE FOR PROCESSING.   DO NOT GIVE THEM TO YOUR DOCTOR.  PAIN CONTROL  1. First take acetaminophen (Tylenol) AND/or ibuprofen (Advil) to control your pain after surgery.  Follow directions on package.  Taking acetaminophen (Tylenol) and/or ibuprofen (Advil) regularly after surgery will help to control your pain and lower the amount of prescription pain medication you may need.  You should not take more than 3,000 mg (3 grams) of acetaminophen (Tylenol) in 24 hours.  You should not take ibuprofen (Advil), aleve, motrin, naprosyn or other NSAIDS if you have a history of stomach ulcers or chronic kidney disease.  2. A prescription for pain medication may be given to you upon discharge.  Take your pain medication as prescribed, if you still have uncontrolled pain after taking acetaminophen (Tylenol) or ibuprofen (Advil). 3. Use ice packs to help control pain. 4. If you need a refill on your pain medication, please contact your pharmacy.  They will contact our office to request authorization. Prescriptions will not be filled after 5pm or on week-ends.  HOME MEDICATIONS 5. Take your usually prescribed medications unless otherwise directed.  DIET 6. You should follow a light diet the first few days after arrival home.  Be sure to include lots of fluids daily. Avoid fatty, fried foods.   CONSTIPATION 7. It is common to experience some constipation after surgery and if you are taking pain medication.  Increasing fluid intake and taking a stool softener (such as Colace) will usually help or prevent this problem from occurring.  A mild laxative (Milk of Magnesia or Miralax) should be taken according to package instructions if there are  no bowel movements after 48 hours.  WOUND/INCISION CARE 8. Most patients will experience some swelling and bruising in the area of the incisions.  Ice packs will help.  Swelling and bruising can take several days to resolve.  9. Unless discharge instructions indicate otherwise, follow  guidelines below  a. STERI-STRIPS - you may remove your outer bandages 48 hours after surgery, and you may shower at that time.  You have steri-strips (small skin tapes) in place directly over the incision.  These strips should be left on the skin for 7-10 days.   b. DERMABOND/SKIN GLUE - you may shower in 24 hours.  The glue will flake off over the next 2-3 weeks. 10. Any sutures or staples will be removed at the office during your follow-up visit.  ACTIVITIES 11. You may resume regular (light) daily activities beginning the next day--such as daily self-care, walking, climbing stairs--gradually increasing activities as tolerated.  You may have sexual intercourse when it is comfortable.  Refrain from any heavy lifting or straining until approved by your doctor. a. You may drive when you are no longer taking prescription pain medication, you can comfortably wear a seatbelt, and you can safely maneuver your car and apply brakes.  FOLLOW-UP 12. You should see your doctor in the office for a follow-up appointment approximately 2-3 weeks after your surgery.  You should have been given your post-op/follow-up appointment when your surgery was scheduled.  If you did not receive a post-op/follow-up appointment, make sure that you call for this appointment within a day or two after you arrive home to insure a convenient appointment time.   WHEN TO CALL YOUR DOCTOR: 1. Fever over 101.0 2. Inability to urinate 3. Continued bleeding from incision. 4. Increased pain, redness, or drainage from the incision. 5. Increasing abdominal pain  The clinic staff is available to answer your questions during regular business hours.   Please dont hesitate to call and ask to speak to one of the nurses for clinical concerns.  If you have a medical emergency, go to the nearest emergency room or call 911.  A surgeon from [facility] is always on call at the hospital. [address], [place], [place], [place]  [address] ? [address], [place],    [address] [phone] ? [phone] ? FAX [phone] Web site: [email or web address]

## 2018-10-03 NOTE — Care Management Obs Status (Signed)
[facility] NOTIFICATION   Patient Details  Name: Valerie Obrien MRN: [id number] Date of Birth: 1945/08/18   Medicare Observation Status Notification Given:  Yes    Marilu Favre, RN 10/03/2018, 10:10 AM

## 2018-10-03 NOTE — Discharge Summary (Signed)
History and Physical    ELLISHA Obrien UTM:[id number] DOB: 11-Mar-1946 DOA: 10/01/2018  PCP: Shon Baton, MD  Patient coming from: Home.  Chief Complaint: Abdominal pain.  HPI: Valerie Obrien is a 73 y.o. female with history of hypertension and hypothyroidism was referred to the ER after patient labs came abnormal done by her primary care physician.  Patient states this morning around 11:00 patient started developing epigastric and right upper quadrant pain following which patient had at least 5 episodes of vomiting.  No blood in the vomitus.  Pain lasted for almost 2 to 3 hours.  Had gone to her PCP and had labs done which showed elevated LFTs and was advised to come to the ER.  By then patient's pain is resolved.  No diarrhea.  Denies any chest pain productive cough fever or chills.  No contact with any COVID-19 patients. In the ER patient had a sonogram of the abdomen done which shows gallstones with thickened gallbladder negative Murphy sign.  LFTs were elevated at AST of 448 ALT of 252 bilirubin 1.8.  EKG was showing normal sinus rhythm.  On-call general surgeon Dr. Dema Severin has been consulted and patient admitted for further management.  At admission patient blood pressure was markedly elevated at systolic more than 568.  Hospital Course: Patient admitted for acute cholecystitis and tolerated lap chole with gen surgery on 4/23 and now tolerating PO, pain well controlled and otherwise stable for DC home. Labs continue to improve with AST/ALT/Tbili downtrending appropriately. Will need close follow up with PCP and gen Sx as scheduled over the next 2 weeks.  Review of Systems: As per HPI, rest all negative.   Past Medical History:  Diagnosis Date  . High cholesterol   . Hypertension   . Thyroid disease    hypo    Past Surgical History:  Procedure Laterality Date  . BREAST EXCISIONAL BIOPSY Left   . CATARACT EXTRACTION     left eye  . CHOLECYSTECTOMY N/A 10/02/2018   Procedure: LAPAROSCOPIC  CHOLECYSTECTOMY WITH INTRAOPERATIVE CHOLANGIOGRAM;  Surgeon: Donnie Mesa, MD;  Location: [facility];  Service: General;  Laterality: N/A;     reports that she has never smoked. She has never used smokeless tobacco. She reports previous alcohol use. She reports that she does not use drugs.  Allergies  Allergen Reactions  . Sulfonamide Derivatives Other (See Comments)  . Zosyn [Piperacillin Sod-Tazobactam So] Rash    Family History  Family history unknown: Yes    Prior to Admission medications   Medication Sig Start Date End Date Taking? Authorizing Provider  Calcium Carbonate-Vitamin D (CALTRATE 600+D PO) Take 2 tablets by mouth daily.    [provider]  citalopram (CELEXA) 20 MG tablet daily. as directed 01/24/15   [provider]  diclofenac sodium (VOLTAREN) 1 % GEL APPLY 2-4 GRAM TO AFFECTED AREA 4 TIMES DAILY AS NEEDED 01/27/15   [provider]  DIOVAN 80 MG tablet TAKE 1 TABLET EVERY DAY AS DIRECTED 01/24/15   [provider]  omeprazole (PRILOSEC) 10 MG capsule Take 10 mg by mouth daily.    [provider]  SYNTHROID 88 MCG tablet Take 88 mcg by mouth daily. 02/01/15   [provider]    Physical Exam: Vitals:   10/02/18 2042 10/03/18 0025 10/03/18 0519 10/03/18 1345  BP: (!) 161/72 130/60 (!) 166/77 (!) 176/74  Pulse: 73 72 74 82  Resp: 20 14  18   Temp: 98.6 F (37 C) 98.4 F (36.9  C) 98.5 F (36.9 C) 98 F (36.7 C)  TempSrc: Oral Oral Oral Oral  SpO2: 99% 96% 98% 97%  Weight:      Height:          Constitutional: Moderately built and nourished. Vitals:   10/02/18 2042 10/03/18 0025 10/03/18 0519 10/03/18 1345  BP: (!) 161/72 130/60 (!) 166/77 (!) 176/74  Pulse: 73 72 74 82  Resp: 20 14  18   Temp: 98.6 F (37 C) 98.4 F (36.9 C) 98.5 F (36.9 C) 98 F (36.7 C)  TempSrc: Oral Oral Oral Oral  SpO2: 99% 96% 98% 97%  Weight:      Height:       General:  Pleasantly resting in bed, No acute  distress. HEENT:  Normocephalic atraumatic.  Sclerae nonicteric, noninjected.  Extraocular movements intact bilaterally. Neck:  Without mass or deformity.  Trachea is midline. Lungs:  Clear to auscultate bilaterally without rhonchi, wheeze, or rales. Heart:  Regular rate and rhythm.  Without murmurs, rubs, or gallops. Abdomen:  Soft, moderately tender, nondistended.  Without guarding or rebound.  Surgical bandages clean dry intact Extremities: Without cyanosis, clubbing, edema, or obvious deformity. Vascular:  Dorsalis pedis and posterior tibial pulses palpable bilaterally. Skin:  Warm and dry, no erythema, no ulcerations.    Labs on Admission: I have personally reviewed following labs and imaging studies  CBC: Recent Labs  Lab 10/01/18 1742 10/02/18 0410 10/02/18 1428  WBC 7.9 6.0 9.2  NEUTROABS 5.8 3.9  --   HGB 15.7* 15.6* 14.6  HCT 46.8* 46.9* 44.0  MCV 84.9 84.7 85.6  PLT 131* 208 938   Basic Metabolic Panel: Recent Labs  Lab 10/01/18 1742 10/02/18 0641  NA 139 141  K 4.2 3.3*  CL 104 109  CO2 22 23  GLUCOSE 115* 140*  BUN 14 10  CREATININE 0.95 0.86  CALCIUM 9.8 8.8*   GFR: Estimated Creatinine Clearance: 57.1 mL/min (by C-G formula based on SCr of 0.86 mg/dL). Liver Function Tests: Recent Labs  Lab 10/01/18 1742 10/02/18 0641  AST 448* 180*  ALT 252* 219*  ALKPHOS 133* 137*  BILITOT 1.8* 1.1  PROT 7.0 6.3*  ALBUMIN 4.1 3.5   Recent Labs  Lab 10/01/18 1742  LIPASE 34   No results for input(s): AMMONIA in the last 168 hours. Coagulation Profile: No results for input(s): INR, PROTIME in the last 168 hours. Cardiac Enzymes: No results for input(s): CKTOTAL, CKMB, CKMBINDEX, TROPONINI in the last 168 hours. BNP (last 3 results) No results for input(s): PROBNP in the last 8760 hours. HbA1C: No results for input(s): HGBA1C in the last 72 hours. CBG: No results for input(s): GLUCAP in the last 168 hours. Lipid Profile: No results for input(s):  CHOL, HDL, LDLCALC, TRIG, CHOLHDL, LDLDIRECT in the last 72 hours. Thyroid Function Tests: No results for input(s): TSH, T4TOTAL, FREET4, T3FREE, THYROIDAB in the last 72 hours. Anemia Panel: No results for input(s): VITAMINB12, FOLATE, FERRITIN, TIBC, IRON, RETICCTPCT in the last 72 hours. Urine analysis:    Component Value Date/Time   COLORURINE YELLOW 10/01/2018 2022   APPEARANCEUR CLEAR 10/01/2018 2022   LABSPEC 1.015 10/01/2018 2022   PHURINE 8.0 10/01/2018 2022   GLUCOSEU NEGATIVE 10/01/2018 2022   HGBUR NEGATIVE 10/01/2018 2022   BILIRUBINUR NEGATIVE 10/01/2018 2022   KETONESUR 20 (A) 10/01/2018 2022   PROTEINUR NEGATIVE 10/01/2018 2022   NITRITE NEGATIVE 10/01/2018 2022   LEUKOCYTESUR NEGATIVE 10/01/2018 2022   Sepsis Labs: @LABRCNTIP (procalcitonin:4,lacticidven:4) ) Recent Results (from the past  240 hour(s))  Surgical pcr screen     Status: None   Collection Time: 10/02/18 12:30 AM  Result Value Ref Range Status   MRSA, PCR NEGATIVE NEGATIVE Final   Staphylococcus aureus NEGATIVE NEGATIVE Final    Comment: (NOTE) The Xpert SA Assay (FDA approved for NASAL specimens in patients 48 years of age and older), is one component of a comprehensive surveillance program. It is not intended to diagnose infection nor to guide or monitor treatment. Performed at [facility], [place] [address], [place], [place] [address]      Radiological Exams on Admission: Dg Cholangiogram Operative  Result Date: 10/02/2018 CLINICAL DATA:  73 year old female undergoing laparoscopic cholecystectomy with intraoperative cholangiogram EXAM: INTRAOPERATIVE CHOLANGIOGRAM TECHNIQUE: Cholangiographic images from the C-arm fluoroscopic device were submitted for interpretation post-operatively. Please see the procedural report for the amount of contrast and the fluoroscopy time utilized. COMPARISON:  Right upper quadrant ultrasound 10/01/2018 FINDINGS: Submitted images demonstrate cannulation of the  cystic duct remanent and intraoperative cholangiogram. No evidence of biliary ductal dilatation, stenosis, stricture or choledocholithiasis. Contrast material passes freely through the ampulla and into the duodenum. IMPRESSION: Negative intraoperative cholangiogram. Electronically Signed   By: Jacqulynn Cadet M.D.   On: 10/02/2018 10:51   US Abdomen Limited Ruq  Result Date: 10/01/2018 CLINICAL DATA:  Acute abdominal pain since earlier today. Nausea and vomiting. Elevated white count. EXAM: ULTRASOUND ABDOMEN LIMITED RIGHT UPPER QUADRANT COMPARISON:  None. FINDINGS: Gallbladder: Numerous gallstones, with gallbladder wall thickening. Gallbladder wall thickness 8 mm. Negative sonographic Murphy's sign. Largest single dimension calculus 4 mm. Common bile duct: Diameter: 3.4 mm. Liver: No focal lesion identified. Within normal limits in parenchymal echogenicity. Portal vein is patent on color Doppler imaging with normal direction of blood flow towards the liver. IMPRESSION: Cholelithiasis. Thick-walled gallbladder (8 mm) could represent acute or chronic cholecystitis, although no sonographic Murphy's sign is present. Furthermore, no pericholecystic fluid or biliary ductal dilatation. Correlate clinically. Electronically Signed   By: Staci Righter M.D.   On: 10/01/2018 21:41    EKG: Independently reviewed.  Normal sinus rhythm.  Assessment/Plan Principal Problem:   Acute cholecystitis Active Problems:   Hypertensive urgency   Hypothyroidism    Acute cholecystitis with elevated LFTs, POA -Surgery following, laparoscopic cholecystectomy performed yesterday - patient tolerated well -AST, ALT, T bili downtrending appropriately -Pain well controlled -Advance diet per surgery, continue to advance diet as tolerated.  Intractable nausea vomiting and abdominal pain secondary to above, resolved -Improving with supportive care; continue IV fluids now postoperatively - currently on clears -Advance diet per  surgical recommendations  Hypertensive urgency, in the setting of above, resolved -Likely in the setting of above; continue PRN IV hydralazine and follow clinically -Much more well controlled postoperatively  Rash overnight, questionably secondary to Zosyn, resolved -Single dose of Zosyn was given overnight, reported rash by staff -improved with supportive care -Transition to Cipro and Flagyl per discussion with pharmacy at that time per note - these were held this morning - hold off on ongoing abx as above -Continue to follow along clinically  Hypothyroidism on IV Synthroid replacement until patient can take orally. Depression currently being weaned off citalopram as per the patient -we will need to verify dosing   DVT prophylaxis: SCDs -continue to hold chemical prophylaxis perioperatively Code Status: Full code. Family Communication: None present, attempt to call the family again this afternoon. Disposition Plan: DC home Consults called: General surgery. Admission status: Inpatient.   Little Ishikawa MD [facility]  If 7PM-7AM, please contact night-coverage [email or web address] Password [email or web address]  10/03/2018, 2:15 PM

## 2018-10-03 NOTE — Plan of Care (Signed)

## 2018-12-22 ENCOUNTER — Other Ambulatory Visit: Payer: Self-pay

## 2018-12-22 ENCOUNTER — Ambulatory Visit (INDEPENDENT_AMBULATORY_CARE_PROVIDER_SITE_OTHER): Payer: Medicare Other | Admitting: Cardiology

## 2018-12-22 ENCOUNTER — Encounter: Payer: Self-pay | Admitting: Cardiology

## 2018-12-22 VITALS — BP 150/76 | HR 69 | Temp 98.1°F | Ht 63.0 in | Wt 148.0 lb

## 2018-12-22 DIAGNOSIS — I10 Essential (primary) hypertension: Secondary | ICD-10-CM

## 2018-12-22 DIAGNOSIS — R0789 Other chest pain: Secondary | ICD-10-CM

## 2018-12-22 DIAGNOSIS — R0602 Shortness of breath: Secondary | ICD-10-CM

## 2018-12-22 DIAGNOSIS — R5383 Other fatigue: Secondary | ICD-10-CM

## 2018-12-22 DIAGNOSIS — R9431 Abnormal electrocardiogram [ECG] [EKG]: Secondary | ICD-10-CM

## 2018-12-22 DIAGNOSIS — R5381 Other malaise: Secondary | ICD-10-CM

## 2018-12-22 DIAGNOSIS — E7800 Pure hypercholesterolemia, unspecified: Secondary | ICD-10-CM

## 2018-12-22 MED ORDER — AMLODIPINE BESYLATE 5 MG PO TABS: 5.0000 mg | ORAL_TABLET | Freq: Every day | ORAL | 1 refills | Status: DC

## 2018-12-22 NOTE — Patient Instructions (Signed)
Take Metoprolol in the evening  Take amlodipine 5 mg daily in the morning along with Benicar

## 2018-12-22 NOTE — Progress Notes (Signed)
Primary Physician:  Shon Baton, MD   Patient ID: Valerie Obrien, female    DOB: [birth year], 73 y.o.   MRN: [id number]  Subjective:    Chief Complaint  Patient presents with  . Hypertension  . New Patient (Initial Visit)     HPI: Valerie Obrien  is a 73 y.o. female  with GERD, depression, hyperlipidemia unable to tolerate statins, fibromyalgia, hypothyroidism, history of migraines, hypertension, referred to [place] on Urgent basis by PCP Dr. Virgina Obrien, for recently uncontrolled hypertension.  Patient underwent cholecystectomy in spring this year. For the last several months has had fluctuations and difficult to control hypertension. She states diastolic readings are always high. She has episodes of difficulty taking a good cleansing breath and chest heaviness. No PND or orthopnea. Does not happen with exertion. She did have a migraine for 3 mornings last week. She was previously on Vitamin B2 and Magnesium for migraines but had stopped it. She does notice that she is tired all the time. Mentions that she snores and wakes up feeling tired. She has been walking 3-4 miles per day and does not have shortness of breath or chest pain with this. She does notice that she is tired after this.   She brings her blood pressure readings with her today, diastolic readings consistently in the 78'L and systolic readings in the 381 range with only occasional in the normal range. She has been taking amlodipine 2.5 mg everyday except for Sun and Wed. She does notice BP is higher the mornings after not taking amlodipine.   She has hyperlipidemia and that is uncontrolled.  Her last LDL was 177.  She had previously been on Crestor, but developed right groin pain and limping that improved with stopping Crestor.  She was even unable to tolerate one time a week dose. She has also not tolerated Zetia due to inability to move her jaw after being on medication for 1 month. States resolved with stopping medication.  No former  tobacco use or alcohol use.   Past Medical History:  Diagnosis Date  . High cholesterol   . Hypertension   . Thyroid disease    hypo    Past Surgical History:  Procedure Laterality Date  . BREAST EXCISIONAL BIOPSY Left   . CATARACT EXTRACTION     left eye  . CHOLECYSTECTOMY N/A 10/02/2018   Procedure: LAPAROSCOPIC CHOLECYSTECTOMY WITH INTRAOPERATIVE CHOLANGIOGRAM;  Surgeon: Donnie Mesa, MD;  Location: [facility];  Service: General;  Laterality: N/A;    Social History   Socioeconomic History  . Marital status: Married    Spouse name: Not on file  . Number of children: 2  . Years of education: Not on file  . Highest education level: Not on file  Occupational History  . Not on file  Social Needs  . Financial resource strain: Not on file  . Food insecurity    Worry: Not on file    Inability: Not on file  . Transportation needs    Medical: Not on file    Non-medical: Not on file  Tobacco Use  . Smoking status: Never Smoker  . Smokeless tobacco: Never Used  Substance and Sexual Activity  . Alcohol use: Not Currently    Alcohol/week: 0.0 standard drinks  . Drug use: Never  . Sexual activity: Yes  Lifestyle  . Physical activity    Days per week: Not on file    Minutes per session: Not on file  . Stress: Not on file  Relationships  . Social Herbalist on phone: Not on file    Gets together: Not on file    Attends religious service: Not on file    Active member of club or organization: Not on file    Attends meetings of clubs or organizations: Not on file    Relationship status: Not on file  . Intimate partner violence    Fear of current or ex partner: Not on file    Emotionally abused: Not on file    Physically abused: Not on file    Forced sexual activity: Not on file  Other Topics Concern  . Not on file  Social History Narrative  . Not on file    Review of Systems  Constitution: Positive for malaise/fatigue. Negative for decreased appetite, weight  gain and weight loss.  Eyes: Negative for visual disturbance.  Cardiovascular: Positive for chest pain and dyspnea on exertion. Negative for claudication, leg swelling, orthopnea, palpitations and syncope.  Respiratory: Negative for hemoptysis and wheezing.   Endocrine: Negative for cold intolerance and heat intolerance.  Hematologic/Lymphatic: Does not bruise/bleed easily.  Skin: Negative for nail changes.  Musculoskeletal: Negative for muscle weakness and myalgias.  Gastrointestinal: Negative for abdominal pain, change in bowel habit, nausea and vomiting.  Neurological: Negative for difficulty with concentration, dizziness, focal weakness and headaches.  Psychiatric/Behavioral: Negative for altered mental status and suicidal ideas.  All other systems reviewed and are negative.     Objective:  Blood pressure (!) 150/76, pulse 69, temperature 98.1 F (36.7 C), height '5\' 3"'  (1.6 m), weight 148 lb (67.1 kg), SpO2 97 %. Body mass index is 26.22 kg/m.    Physical Exam  Constitutional: She is oriented to person, place, and time. Vital signs are normal. She appears well-developed and well-nourished.  HENT:  Head: Normocephalic and atraumatic.  Neck: Normal range of motion.  Cardiovascular: Normal rate, regular rhythm, normal heart sounds and intact distal pulses.  Pulmonary/Chest: Effort normal and breath sounds normal. No accessory muscle usage. No respiratory distress.  Abdominal: Soft. Bowel sounds are normal.  Musculoskeletal: Normal range of motion.  Neurological: She is alert and oriented to person, place, and time.  Skin: Skin is warm and dry.  Vitals reviewed.  Radiology: No results found.  Laboratory examination:   Labs 11/18/2018: Creatinine 0.8, EGFR 70, potassium 4.2, CMP.  CBC normal.  Cholesterol 245, triglycerides 126, HDL 43, LDL 177.  TSH normal.  CMP Latest Ref Rng & Units 10/02/2018 10/01/2018  Glucose 70 - 99 mg/dL 140(H) 115(H)  BUN 8 - 23 mg/dL 10 14  Creatinine  0.44 - 1.00 mg/dL 0.86 0.95  Sodium 135 - 145 mmol/L 141 139  Potassium 3.5 - 5.1 mmol/L 3.3(L) 4.2  Chloride 98 - 111 mmol/L 109 104  CO2 22 - 32 mmol/L 23 22  Calcium 8.9 - 10.3 mg/dL 8.8(L) 9.8  Total Protein 6.5 - 8.1 g/dL 6.3(L) 7.0  Total Bilirubin 0.3 - 1.2 mg/dL 1.1 1.8(H)  Alkaline Phos 38 - 126 U/L 137(H) 133(H)  AST 15 - 41 U/L 180(H) 448(H)  ALT 0 - 44 U/L 219(H) 252(H)   CBC Latest Ref Rng & Units 10/02/2018 10/02/2018 10/01/2018  WBC 4.0 - 10.5 K/uL 9.2 6.0 7.9  Hemoglobin 12.0 - 15.0 g/dL 14.6 15.6(H) 15.7(H)  Hematocrit 36.0 - 46.0 % 44.0 46.9(H) 46.8(H)  Platelets 150 - 400 K/uL 228 208 131(L)   Lipid Panel  No results found for: CHOL, TRIG, HDL, CHOLHDL, VLDL, LDLCALC, LDLDIRECT HEMOGLOBIN  A1C No results found for: HGBA1C, MPG TSH No results for input(s): TSH in the last 8760 hours.  PRN Meds:. Medications Discontinued During This Encounter  Medication Reason  . citalopram (CELEXA) 20 MG tablet Error  . oxyCODONE (OXY IR/ROXICODONE) 5 MG immediate release tablet Error   Current Meds  Medication Sig  . acetaminophen (TYLENOL) 325 MG tablet Take 2 tablets (650 mg total) by mouth every 6 (six) hours as needed for mild pain (or Fever >/= 101).  Marland Kitchen amLODipine (NORVASC) 2.5 MG tablet Take 2.5 mg by mouth daily.  . Calcium Carbonate-Vitamin D (CALTRATE 600+D PO) Take 2 tablets by mouth daily.  . diclofenac sodium (VOLTAREN) 1 % GEL Apply 2-4 g topically 4 (four) times daily.   Marland Kitchen ibuprofen (ADVIL) 600 MG tablet Take 1 tablet (600 mg total) by mouth every 6 (six) hours as needed for headache, mild pain or moderate pain.  . metoprolol tartrate (LOPRESSOR) 50 MG tablet Take by mouth daily.   . Multiple Vitamins-Minerals (CENTRUM SILVER 50+WOMEN) TABS Take 1 tablet by mouth daily.  Marland Kitchen olmesartan (BENICAR) 40 MG tablet Take 40 mg by mouth daily.  Marland Kitchen omeprazole (PRILOSEC) 40 MG capsule Take 40 mg by mouth daily.   Marland Kitchen SYNTHROID 112 MCG tablet Take 112 mcg by mouth daily.      Cardiac Studies:     Assessment:     ICD-10-CM   1. Essential hypertension  I10 EKG 12-Lead    PCV MYOCARDIAL PERFUSION WITH LEXISCAN    PCV ECHOCARDIOGRAM COMPLETE  2. Abnormal EKG  R94.31   3. Shortness of breath  R06.02 PCV MYOCARDIAL PERFUSION WITH LEXISCAN    PCV ECHOCARDIOGRAM COMPLETE  4. Malaise and fatigue  R53.81 PCV MYOCARDIAL PERFUSION WITH LEXISCAN   R53.83 PCV ECHOCARDIOGRAM COMPLETE  5. Pure hypercholesterolemia  E78.00     EKG 12/22/2018: Normal sinus rhythm at 65 bpm, right axis deviation, left posterior fasicular block, PRWP, cannot exclude anterior infarct old. IRBBB. Abnormal EKG.   Recommendations:   Over the last few months, patient has had fluctuations in her blood pressure that has been difficult to control.  She does have fatigue and shortness of breath, states that she feels that she cannot take a deep breath.  She has been walking daily and generally tolerates this well without any exertional chest pain.  I suspect her shortness of breath and chest heaviness is related to her hypertension.  She does have uncontrolled hyperlipidemia and abnormal EKG, although is unchanged from previous EKGs.  Will recommend further evaluation with Lexiscan nuclear stress testing.  We will also obtain echocardiogram to exclude any structural abnormalities.  Physical exam is unremarkable. She appears to be very sensitive to medications and is reluctant to them, I will increase her amlodipine to 5 mg and she will take daily.  If blood pressure does not improve with this, will consider addition of Aldactone.  She will notify me in the next 1 to 2 weeks of her blood pressure response as she monitors this regularly.  She does have symptoms concerning for sleep apnea and given her uncontrolled hypertension, feel that this should be evaluated.  We will plan to make referral for this at her next office visit.  In regards to hyperlipidemia, she has been intolerant to multiple statins in the  past and even Zetia.  Could consider Nexlitol (Bempidoic acid) or PCSK9 inhibitor.  Suspect some changes to her diet and weight loss would also help with this as well as with her blood pressure.  I will see her back in 4 to 6 weeks for follow-up on hypertension and to discuss test results.  Miquel Dunn, MSN, APRN, FNP-C [facility]. [place] Office: [phone] Fax: [phone]

## 2018-12-29 ENCOUNTER — Other Ambulatory Visit: Payer: Self-pay

## 2018-12-29 ENCOUNTER — Ambulatory Visit (INDEPENDENT_AMBULATORY_CARE_PROVIDER_SITE_OTHER): Payer: Medicare Other

## 2018-12-29 DIAGNOSIS — R0602 Shortness of breath: Secondary | ICD-10-CM

## 2018-12-29 DIAGNOSIS — R5383 Other fatigue: Secondary | ICD-10-CM

## 2018-12-29 DIAGNOSIS — R5381 Other malaise: Secondary | ICD-10-CM

## 2018-12-29 DIAGNOSIS — I10 Essential (primary) hypertension: Secondary | ICD-10-CM | POA: Diagnosis not present

## 2018-12-31 ENCOUNTER — Other Ambulatory Visit: Payer: Self-pay

## 2019-01-01 NOTE — Progress Notes (Signed)
LVM with results

## 2019-01-06 ENCOUNTER — Other Ambulatory Visit: Payer: Self-pay | Admitting: Internal Medicine

## 2019-01-06 DIAGNOSIS — Z1231 Encounter for screening mammogram for malignant neoplasm of breast: Secondary | ICD-10-CM

## 2019-01-07 ENCOUNTER — Other Ambulatory Visit: Payer: Self-pay

## 2019-01-07 ENCOUNTER — Ambulatory Visit: Payer: Medicare Other

## 2019-01-07 DIAGNOSIS — R5383 Other fatigue: Secondary | ICD-10-CM | POA: Diagnosis not present

## 2019-01-07 DIAGNOSIS — R0602 Shortness of breath: Secondary | ICD-10-CM

## 2019-01-07 DIAGNOSIS — R5381 Other malaise: Secondary | ICD-10-CM | POA: Diagnosis not present

## 2019-01-07 DIAGNOSIS — I10 Essential (primary) hypertension: Secondary | ICD-10-CM

## 2019-02-04 ENCOUNTER — Other Ambulatory Visit: Payer: Self-pay

## 2019-02-04 ENCOUNTER — Ambulatory Visit (INDEPENDENT_AMBULATORY_CARE_PROVIDER_SITE_OTHER): Payer: Medicare Other | Admitting: Cardiology

## 2019-02-04 ENCOUNTER — Encounter: Payer: Self-pay | Admitting: Cardiology

## 2019-02-04 VITALS — BP 147/75 | HR 55 | Temp 98.2°F | Ht 63.0 in | Wt 145.0 lb

## 2019-02-04 DIAGNOSIS — E7800 Pure hypercholesterolemia, unspecified: Secondary | ICD-10-CM | POA: Diagnosis not present

## 2019-02-04 DIAGNOSIS — I10 Essential (primary) hypertension: Secondary | ICD-10-CM | POA: Diagnosis not present

## 2019-02-04 DIAGNOSIS — R9431 Abnormal electrocardiogram [ECG] [EKG]: Secondary | ICD-10-CM | POA: Diagnosis not present

## 2019-02-04 MED ORDER — ROSUVASTATIN CALCIUM 5 MG PO TABS: 5.0000 mg | ORAL_TABLET | Freq: Every day | ORAL | 2 refills | Status: AC

## 2019-02-04 MED ORDER — AMLODIPINE BESYLATE 5 MG PO TABS: 5.0000 mg | ORAL_TABLET | Freq: Every day | ORAL | 1 refills | Status: DC

## 2019-02-04 NOTE — Progress Notes (Signed)
Primary Physician:  Shon Baton, MD   Patient ID: Valerie Obrien, female    DOB: 28-Jan-1946, 73 y.o.   MRN: [id number]  Subjective:    Chief Complaint  Patient presents with  . Hypertension  . Follow-up    6 week     HPI: Valerie Obrien  is a 73 y.o. female  with GERD, depression, hyperlipidemia unable to tolerate statins, fibromyalgia, hypothyroidism, history of migraines, hypertension, recently evaluated by [place] for uncontrolled hypertension.  For the last several months has had fluctuations and difficult to control hypertension.  She was previously having symptoms with chest heaviness and taking good cleansing breath with elevations in her blood pressure.  At her last office visit, she was started on metoprolol and amlodipine was increased to 5 mg daily.  She is tolerating medications well and has been feeling overall much better.  She has been walking daily and tolerates this well. She is checked her blood pressure a few times at home and has been in the 130/70 range.  She states she was very anxious regarding coming into our office today to discuss her test results.  She underwent echocardiogram and Lexiscan nuclear stress test and presents discuss results.    She has hyperlipidemia and that is uncontrolled.  Her last LDL was 177.  She had previously been on Crestor, but developed right groin pain and limping that improved with stopping Crestor. She has also not tolerated Zetia due to inability to move her jaw after being on medication for 1 month. States resolved with stopping medication.  No former tobacco use or alcohol use.   Past Medical History:  Diagnosis Date  . High cholesterol   . Hypertension   . Thyroid disease    hypo    Past Surgical History:  Procedure Laterality Date  . BREAST EXCISIONAL BIOPSY Left   . CATARACT EXTRACTION     left eye  . CHOLECYSTECTOMY N/A 10/02/2018   Procedure: LAPAROSCOPIC CHOLECYSTECTOMY WITH INTRAOPERATIVE CHOLANGIOGRAM;  Surgeon: Donnie Mesa, MD;  Location: [facility];  Service: General;  Laterality: N/A;    Social History   Socioeconomic History  . Marital status: Married    Spouse name: Not on file  . Number of children: 2  . Years of education: Not on file  . Highest education level: Not on file  Occupational History  . Not on file  Social Needs  . Financial resource strain: Not on file  . Food insecurity    Worry: Not on file    Inability: Not on file  . Transportation needs    Medical: Not on file    Non-medical: Not on file  Tobacco Use  . Smoking status: Never Smoker  . Smokeless tobacco: Never Used  Substance and Sexual Activity  . Alcohol use: Not Currently    Alcohol/week: 0.0 standard drinks  . Drug use: Never  . Sexual activity: Yes  Lifestyle  . Physical activity    Days per week: Not on file    Minutes per session: Not on file  . Stress: Not on file  Relationships  . Social Herbalist on phone: Not on file    Gets together: Not on file    Attends religious service: Not on file    Active member of club or organization: Not on file    Attends meetings of clubs or organizations: Not on file    Relationship status: Not on file  . Intimate partner violence  Fear of current or ex partner: Not on file    Emotionally abused: Not on file    Physically abused: Not on file    Forced sexual activity: Not on file  Other Topics Concern  . Not on file  Social History Narrative  . Not on file    Review of Systems  Constitution: Negative for decreased appetite, malaise/fatigue, weight gain and weight loss.  Eyes: Negative for visual disturbance.  Cardiovascular: Negative for chest pain, claudication, dyspnea on exertion, leg swelling, orthopnea, palpitations and syncope.  Respiratory: Negative for hemoptysis and wheezing.   Endocrine: Negative for cold intolerance and heat intolerance.  Hematologic/Lymphatic: Does not bruise/bleed easily.  Skin: Negative for nail changes.   Musculoskeletal: Negative for muscle weakness and myalgias.  Gastrointestinal: Negative for abdominal pain, change in bowel habit, nausea and vomiting.  Neurological: Negative for difficulty with concentration, dizziness, focal weakness and headaches.  Psychiatric/Behavioral: Negative for altered mental status and suicidal ideas.  All other systems reviewed and are negative.     Objective:  Blood pressure (!) 147/75, pulse (!) 55, temperature 98.2 F (36.8 C), height '5\' 3"'  (1.6 m), weight 145 lb (65.8 kg), SpO2 98 %. Body mass index is 25.69 kg/m.    Physical Exam  Constitutional: She is oriented to person, place, and time. Vital signs are normal. She appears well-developed and well-nourished.  HENT:  Head: Normocephalic and atraumatic.  Neck: Normal range of motion.  Cardiovascular: Normal rate, regular rhythm, normal heart sounds and intact distal pulses.  Pulmonary/Chest: Effort normal and breath sounds normal. No accessory muscle usage. No respiratory distress.  Abdominal: Soft. Bowel sounds are normal.  Musculoskeletal: Normal range of motion.  Neurological: She is alert and oriented to person, place, and time.  Skin: Skin is warm and dry.  Vitals reviewed.  Radiology: No results found.  Laboratory examination:   Labs 11/18/2018: Creatinine 0.8, EGFR 70, potassium 4.2, CMP.  CBC normal.  Cholesterol 245, triglycerides 126, HDL 43, LDL 177.  TSH normal.  CMP Latest Ref Rng & Units 10/02/2018 10/01/2018  Glucose 70 - 99 mg/dL 140(H) 115(H)  BUN 8 - 23 mg/dL 10 14  Creatinine 0.44 - 1.00 mg/dL 0.86 0.95  Sodium 135 - 145 mmol/L 141 139  Potassium 3.5 - 5.1 mmol/L 3.3(L) 4.2  Chloride 98 - 111 mmol/L 109 104  CO2 22 - 32 mmol/L 23 22  Calcium 8.9 - 10.3 mg/dL 8.8(L) 9.8  Total Protein 6.5 - 8.1 g/dL 6.3(L) 7.0  Total Bilirubin 0.3 - 1.2 mg/dL 1.1 1.8(H)  Alkaline Phos 38 - 126 U/L 137(H) 133(H)  AST 15 - 41 U/L 180(H) 448(H)  ALT 0 - 44 U/L 219(H) 252(H)   CBC Latest Ref  Rng & Units 10/02/2018 10/02/2018 10/01/2018  WBC 4.0 - 10.5 K/uL 9.2 6.0 7.9  Hemoglobin 12.0 - 15.0 g/dL 14.6 15.6(H) 15.7(H)  Hematocrit 36.0 - 46.0 % 44.0 46.9(H) 46.8(H)  Platelets 150 - 400 K/uL 228 208 131(L)   Lipid Panel  No results found for: CHOL, TRIG, HDL, CHOLHDL, VLDL, LDLCALC, LDLDIRECT HEMOGLOBIN A1C No results found for: HGBA1C, MPG TSH No results for input(s): TSH in the last 8760 hours.  PRN Meds:. Medications Discontinued During This Encounter  Medication Reason  . ibuprofen (ADVIL) 600 MG tablet   . amLODipine (NORVASC) 5 MG tablet Reorder   Current Meds  Medication Sig  . acetaminophen (TYLENOL) 325 MG tablet Take 2 tablets (650 mg total) by mouth every 6 (six) hours as needed for mild  pain (or Fever >/= 101).  Marland Kitchen amLODipine (NORVASC) 5 MG tablet Take 1 tablet (5 mg total) by mouth daily.  . Calcium Carbonate-Vitamin D (CALTRATE 600+D PO) Take 2 tablets by mouth daily.  . diclofenac sodium (VOLTAREN) 1 % GEL Apply 2-4 g topically 4 (four) times daily.   . metoprolol succinate (TOPROL-XL) 50 MG 24 hr tablet Take 50 mg by mouth daily. Take with or immediately following a meal.  . Multiple Vitamins-Minerals (CENTRUM SILVER 50+WOMEN) TABS Take 1 tablet by mouth daily.  Marland Kitchen olmesartan (BENICAR) 40 MG tablet Take 40 mg by mouth daily.  Marland Kitchen omeprazole (PRILOSEC) 40 MG capsule Take 40 mg by mouth daily.   Marland Kitchen SYNTHROID 112 MCG tablet Take 112 mcg by mouth daily.   . [DISCONTINUED] amLODipine (NORVASC) 5 MG tablet Take 1 tablet (5 mg total) by mouth daily.    Cardiac Studies:   Lexiscan Myoview Stress Test 12/29/2018: Stress EKG is non-diagnostic, as this is pharmacological stress test. Myocardial perfusion imaging is normal. Left ventricular ejection fraction is  71% with normal wall motion. Low risk study.  Echo- 01/07/2019 1. Normal LV systolic function with EF 59%. Left ventricle cavity is normal in size. Mild concentric hypertrophy of the left ventricle. Normal global  wall motion. Normal diastolic filling pattern. Calculated EF 59%. 2. Mild (Grade I) mitral regurgitation.  Assessment:     ICD-10-CM   1. Essential hypertension  I10   2. Pure hypercholesterolemia  E78.00 Lipid panel    Comprehensive Metabolic Panel (CMET)  3. Abnormal EKG  R94.31      EKG 12/22/2018: Normal sinus rhythm at 65 bpm, right axis deviation, left posterior fasicular block, PRWP, cannot exclude anterior infarct old. IRBBB. Abnormal EKG.   Recommendations:   I discussed recently obtained Lexiscan nuclear stress test results with the patient, low risk study without any evidence of perfusion abnormalities.  Echocardiogram was essentially normal with mild LVH likely related to hypertension.  I recommended continued aggressive blood pressure control.  She has been feeling much better since being on her medications and feels that her blood pressure has significantly improved.  She continues to be slightly high in our office today, but feels that this was related to anxiety about discussing her test results.  Her blood pressure has been much better at home.  She will continue with home monitoring.  Encouraged her to continue with regular exercise and diet modifications.  She does have markedly elevated lipids and has previously been intolerant to multiple statins and even Zetia.  After long discussion with the patient regarding cardiovascular benefits with statin therapy, she is willing to try Crestor low-dose once a week.  Will repeat lipids and CMP in 8 weeks. Can also consider re-challenging her on Zetia. I will see her back in 8 weeks after lipids for follow up.   Miquel Dunn, MSN, APRN, FNP-C [facility]. [place] Office: [phone] Fax: [phone]

## 2019-02-13 ENCOUNTER — Telehealth: Payer: Self-pay

## 2019-02-13 NOTE — Telephone Encounter (Signed)
Pt called and cannot tolerate crestor even 1x a week, should she still keep f/u appt, please advise?

## 2019-02-13 NOTE — Telephone Encounter (Signed)
She is going to think about the new meds and call us back Tuesday

## 2019-02-19 ENCOUNTER — Ambulatory Visit: Payer: Medicare Other

## 2019-03-24 ENCOUNTER — Ambulatory Visit
Admission: RE | Admit: 2019-03-24 | Discharge: 2019-03-24 | Disposition: A | Discharge status: Home / Self Care | Payer: Medicare Other | Source: Ambulatory Visit | Attending: Internal Medicine | Admitting: Internal Medicine

## 2019-03-24 ENCOUNTER — Other Ambulatory Visit: Payer: Self-pay

## 2019-03-24 DIAGNOSIS — Z1231 Encounter for screening mammogram for malignant neoplasm of breast: Secondary | ICD-10-CM

## 2019-03-27 ENCOUNTER — Telehealth: Payer: Self-pay

## 2019-03-27 NOTE — Telephone Encounter (Signed)
error 

## 2019-03-27 NOTE — Telephone Encounter (Signed)
Patient is not taking Crestor. She wants to know if it necessary to have labs drawn and keep her appointment. Please advise.

## 2019-03-27 NOTE — Telephone Encounter (Signed)
LM regarding labs and following up with [place].

## 2019-04-01 ENCOUNTER — Ambulatory Visit: Payer: Medicare Other | Admitting: Cardiology

## 2019-06-09 ENCOUNTER — Ambulatory Visit: Payer: Medicare Other | Attending: Family Medicine

## 2019-06-09 DIAGNOSIS — Z20822 Contact with and (suspected) exposure to covid-19: Secondary | ICD-10-CM

## 2019-06-10 LAB — NOVEL CORONAVIRUS, NAA: SARS-CoV-2, NAA: NOT DETECTED

## 2019-06-30 ENCOUNTER — Ambulatory Visit: Payer: Medicare Other | Attending: Internal Medicine

## 2019-06-30 DIAGNOSIS — Z23 Encounter for immunization: Secondary | ICD-10-CM | POA: Insufficient documentation

## 2019-06-30 NOTE — Progress Notes (Signed)
   Covid-19 Vaccination Clinic  Name:  Valerie Obrien    MRN: [id number] DOB: 18-Dec-1945  06/30/2019  [name] was observed post Covid-19 immunization for 15 minutes without incidence. She was provided with Vaccine Information Sheet and instruction to access the V-Safe system.   [name] was instructed to call 911 with any severe reactions post vaccine: Marland Kitchen Difficulty breathing  . Swelling of your face and throat  . A fast heartbeat  . A bad rash all over your body  . Dizziness and weakness    Immunizations Administered    Name Date Dose VIS Date Route   Pfizer COVID-19 Vaccine 06/30/2019  4:46 PM 0.3 mL 05/22/2019 Intramuscular   Manufacturer: [place]   Lot: [id number]   [place]: [id number]

## 2019-07-21 ENCOUNTER — Ambulatory Visit: Payer: Medicare Other | Attending: Internal Medicine

## 2019-07-21 DIAGNOSIS — Z23 Encounter for immunization: Secondary | ICD-10-CM | POA: Insufficient documentation

## 2019-07-21 NOTE — Progress Notes (Signed)
   Covid-19 Vaccination Clinic  Name:  Valerie Obrien    MRN: [id number] DOB: 18-Jul-1945  07/21/2019  [name] was observed post Covid-19 immunization for 15 minutes without incidence. She was provided with Vaccine Information Sheet and instruction to access the V-Safe system.   [name] was instructed to call 911 with any severe reactions post vaccine: Marland Kitchen Difficulty breathing  . Swelling of your face and throat  . A fast heartbeat  . A bad rash all over your body  . Dizziness and weakness    Immunizations Administered    Name Date Dose VIS Date Route   Pfizer COVID-19 Vaccine 07/21/2019 11:10 AM 0.3 mL 05/22/2019 Intramuscular   Manufacturer: [employer], [employer]   Lot: [id number]   [place]: [id number]

## 2019-08-10 ENCOUNTER — Other Ambulatory Visit: Payer: Self-pay | Admitting: Cardiology

## 2020-01-24 ENCOUNTER — Other Ambulatory Visit: Payer: Self-pay | Admitting: Cardiology

## 2020-02-19 ENCOUNTER — Other Ambulatory Visit: Payer: Self-pay | Admitting: Internal Medicine

## 2020-02-19 DIAGNOSIS — Z1231 Encounter for screening mammogram for malignant neoplasm of breast: Secondary | ICD-10-CM

## 2020-03-24 ENCOUNTER — Ambulatory Visit
Admission: RE | Admit: 2020-03-24 | Discharge: 2020-03-24 | Disposition: A | Discharge status: Home / Self Care | Payer: Medicare Other | Source: Ambulatory Visit | Attending: Internal Medicine | Admitting: Internal Medicine

## 2020-03-24 ENCOUNTER — Other Ambulatory Visit: Payer: Self-pay

## 2020-03-24 DIAGNOSIS — Z1231 Encounter for screening mammogram for malignant neoplasm of breast: Secondary | ICD-10-CM

## 2021-02-24 ENCOUNTER — Other Ambulatory Visit: Payer: Self-pay | Admitting: Internal Medicine

## 2021-02-24 DIAGNOSIS — Z1231 Encounter for screening mammogram for malignant neoplasm of breast: Secondary | ICD-10-CM

## 2021-03-27 ENCOUNTER — Ambulatory Visit
Admission: RE | Admit: 2021-03-27 | Discharge: 2021-03-27 | Disposition: A | Discharge status: Home / Self Care | Payer: Medicare Other | Source: Ambulatory Visit | Attending: Internal Medicine | Admitting: Internal Medicine

## 2021-03-27 ENCOUNTER — Ambulatory Visit: Payer: Medicare Other

## 2021-03-27 ENCOUNTER — Other Ambulatory Visit: Payer: Self-pay

## 2021-03-27 DIAGNOSIS — Z1231 Encounter for screening mammogram for malignant neoplasm of breast: Secondary | ICD-10-CM

## 2021-05-14 ENCOUNTER — Ambulatory Visit (HOSPITAL_COMMUNITY)
Admission: EM | Admit: 2021-05-14 | Discharge: 2021-05-14 | Disposition: A | Discharge status: Home / Self Care | Payer: Medicare Other | Attending: Student | Admitting: Student

## 2021-05-14 ENCOUNTER — Other Ambulatory Visit: Payer: Self-pay

## 2021-05-14 ENCOUNTER — Encounter (HOSPITAL_COMMUNITY): Payer: Self-pay

## 2021-05-14 ENCOUNTER — Ambulatory Visit (INDEPENDENT_AMBULATORY_CARE_PROVIDER_SITE_OTHER): Payer: Medicare Other

## 2021-05-14 DIAGNOSIS — J069 Acute upper respiratory infection, unspecified: Secondary | ICD-10-CM | POA: Diagnosis not present

## 2021-05-14 DIAGNOSIS — R059 Cough, unspecified: Secondary | ICD-10-CM | POA: Diagnosis not present

## 2021-05-14 NOTE — ED Triage Notes (Signed)
Pt reports cough x 1 week. Pt taking Cefdinir x 10 days. Pt reports her PCP recommend her to have a chest  X-ray to ruled out pneumonia.

## 2021-05-14 NOTE — Discharge Instructions (Addendum)
-  With a virus, you're typically contagious for 5-7 days, or as long as you're having fevers.

## 2021-05-14 NOTE — ED Provider Notes (Signed)
Valerie Obrien    CSN: [id number] Arrival date & time: 05/14/21  1013      History   Chief Complaint Chief Complaint  Patient presents with   Cough    HPI RAI SINAGRA is a 75 y.o. female presenting with cough x7 days. Medical history noncontributory, denies history pulm ds. Nonproductive cough x7 days, resolving on its own. Denies SOB, fevers/chills, CP, weakness, myalgias. Has already been evaluated by PCP and they prescribed Cefdinir for bronchitis. Apparently they are now concerned for pneumonia given continued symptoms and are recommending a chest xray, so she presented to this urgent care today.  HPI  Past Medical History:  Diagnosis Date   High cholesterol    Hypertension    Thyroid disease    hypo    Patient Active Problem List   Diagnosis Date Noted   Acute cholecystitis 10/01/2018   Hypertensive urgency 10/01/2018   Hypothyroidism 10/01/2018   KNEE PAIN 05/02/2010   METATARSALGIA 05/02/2010   FOOT PAIN, BILATERAL 05/02/2010    Past Surgical History:  Procedure Laterality Date   BREAST EXCISIONAL BIOPSY Left    CATARACT EXTRACTION     left eye   CHOLECYSTECTOMY N/A 10/02/2018   Procedure: LAPAROSCOPIC CHOLECYSTECTOMY WITH INTRAOPERATIVE CHOLANGIOGRAM;  Surgeon: Donnie Mesa, MD;  Location: [place];  Service: General;  Laterality: N/A;    OB History   No obstetric history on file.      Home Medications    Prior to Admission medications   Medication Sig Start Date End Date Taking? Authorizing Provider  cefdinir (OMNICEF) 300 MG capsule Take 300 mg by mouth 2 (two) times daily.   Yes [provider]  acetaminophen (TYLENOL) 325 MG tablet Take 2 tablets (650 mg total) by mouth every 6 (six) hours as needed for mild pain (or Fever >/= 101). 10/02/18   Norm Parcel, PA-C  amLODipine (NORVASC) 5 MG tablet TAKE 1 TABLET BY MOUTH EVERY DAY 08/10/19   Miquel Dunn, NP  Calcium Carbonate-Vitamin D (CALTRATE 600+D PO) Take 2 tablets  by mouth daily.    [provider]  diclofenac sodium (VOLTAREN) 1 % GEL Apply 2-4 g topically 4 (four) times daily.  01/27/15   [provider]  metoprolol succinate (TOPROL-XL) 50 MG 24 hr tablet Take 50 mg by mouth daily. Take with or immediately following a meal.    [provider]  Multiple Vitamins-Minerals (CENTRUM SILVER 50+WOMEN) TABS Take 1 tablet by mouth daily.    [provider]  olmesartan (BENICAR) 40 MG tablet Take 40 mg by mouth daily.    [provider]  omeprazole (PRILOSEC) 40 MG capsule Take 40 mg by mouth daily.     [provider]  rosuvastatin (CRESTOR) 5 MG tablet Take 1 tablet (5 mg total) by mouth daily. 02/04/19 05/05/19  Miquel Dunn, NP  SYNTHROID 112 MCG tablet Take 112 mcg by mouth daily.  02/01/15   [provider]    Family History Family History  Problem Relation Age of Onset   Heart attack Mother    Stroke Mother     Social History Social History   Tobacco Use   Smoking status: Never   Smokeless tobacco: Never  Vaping Use   Vaping Use: Never used  Substance Use Topics   Alcohol use: Not Currently    Alcohol/week: 0.0 standard drinks   Drug use: Never     Allergies   Crestor [rosuvastatin calcium], Erythromycin, Gatifloxacin, Povidone iodine, Sulfa antibiotics,  Sulfonamide derivatives, Erythromycin base, Prednisone, Shellfish allergy, and Zosyn [piperacillin sod-tazobactam so]   Review of Systems Review of Systems  Constitutional:  Negative for appetite change, chills and fever.  HENT:  Positive for congestion. Negative for ear pain, rhinorrhea, sinus pressure, sinus pain and sore throat.   Eyes:  Negative for redness and visual disturbance.  Respiratory:  Positive for cough. Negative for chest tightness, shortness of breath and wheezing.   Cardiovascular:  Negative for chest pain and palpitations.  Gastrointestinal:  Negative for abdominal pain, constipation, diarrhea,  nausea and vomiting.  Genitourinary:  Negative for dysuria, frequency and urgency.  Musculoskeletal:  Negative for myalgias.  Neurological:  Negative for dizziness, weakness and headaches.  Psychiatric/Behavioral:  Negative for confusion.   All other systems reviewed and are negative.   Physical Exam Triage Vital Signs ED Triage Vitals  Enc Vitals Group     BP 05/14/21 1141 (!) 143/93     Pulse Rate 05/14/21 1141 (!) 58     Resp 05/14/21 1141 18     Temp 05/14/21 1141 98 F (36.7 C)     Temp Source 05/14/21 1141 Oral     SpO2 05/14/21 1141 99 %     Weight --      Height --      Head Circumference --      Peak Flow --      Pain Score 05/14/21 1139 0     Pain Loc --      Pain Edu? --      Excl. in [place]? --    No data found.  Updated Vital Signs BP (!) 143/93 (BP Location: Right Arm)   Pulse (!) 58   Temp 98 F (36.7 C) (Oral)   Resp 18   SpO2 99%   Visual Acuity Right Eye Distance:   Left Eye Distance:   Bilateral Distance:    Right Eye Near:   Left Eye Near:    Bilateral Near:     Physical Exam Vitals reviewed.  Constitutional:      General: She is not in acute distress.    Appearance: Normal appearance. She is not ill-appearing.  HENT:     Head: Normocephalic and atraumatic.     Right Ear: Tympanic membrane, ear canal and external ear normal. No tenderness. No middle ear effusion. There is no impacted cerumen. Tympanic membrane is not perforated, erythematous, retracted or bulging.     Left Ear: Tympanic membrane, ear canal and external ear normal. No tenderness.  No middle ear effusion. There is no impacted cerumen. Tympanic membrane is not perforated, erythematous, retracted or bulging.     Nose: Nose normal. No congestion.     Mouth/Throat:     Mouth: Mucous membranes are moist.     Pharynx: Uvula midline. No oropharyngeal exudate or posterior oropharyngeal erythema.  Eyes:     Extraocular Movements: Extraocular movements intact.     Pupils: Pupils are  equal, round, and reactive to light.  Cardiovascular:     Rate and Rhythm: Normal rate and regular rhythm.     Heart sounds: Normal heart sounds.  Pulmonary:     Effort: Pulmonary effort is normal.     Breath sounds: Normal breath sounds. No decreased breath sounds, wheezing, rhonchi or rales.  Abdominal:     Palpations: Abdomen is soft.     Tenderness: There is no abdominal tenderness. There is no guarding or rebound.  Lymphadenopathy:     Cervical: No cervical adenopathy.  Right cervical: No superficial cervical adenopathy.    Left cervical: No superficial cervical adenopathy.  Neurological:     General: No focal deficit present.     Mental Status: She is alert and oriented to person, place, and time.  Psychiatric:        Mood and Affect: Mood normal.        Behavior: Behavior normal.        Thought Content: Thought content normal.        Judgment: Judgment normal.     UC Treatments / Results  Labs (all labs ordered are listed, but only abnormal results are displayed) Labs Reviewed - No data to display  EKG   Radiology DG Chest 2 View  Result Date: 05/14/2021 CLINICAL DATA:  Cough and congestion. EXAM: CHEST - 2 VIEW COMPARISON:  None. FINDINGS: Heart size and mediastinal contours are within normal limits. Lungs are clear. No pleural effusion. Osseous structures about the chest are unremarkable. IMPRESSION: No active cardiopulmonary disease.  No evidence of pneumonia. Electronically Signed   By: Franki Cabot M.D.   On: 05/14/2021 12:33    Procedures Procedures (including critical care time)  Medications Ordered in UC Medications - No data to display  Initial Impression / Assessment and Plan / UC Course  I have reviewed the triage vital signs and the nursing notes.  Pertinent labs & imaging results that were available during my care of the patient were reviewed by me and considered in my medical decision making (see chart for details).     This patient is a  very pleasant 75 y.o. year old female presenting with viral URI with cough. Today this pt is afebrile nontachycardic nontachypneic, oxygenating well on room air, no wheezes rhonchi or rales.   Xray chest taken at patient request: No active cardiopulmonary disease.  No evidence of pneumonia.  This patient is already taking Cefdinir for viral bronchitis as prescribed by PCP. Though this is not medically indicated, she can continue it as directed by PCP. There is no SOB, CP, etc. today.  ED return precautions discussed. Patient verbalizes understanding and agreement.    Final Clinical Impressions(s) / UC Diagnoses   Final diagnoses:  Viral URI with cough     Discharge Instructions      -With a virus, you're typically contagious for 5-7 days, or as long as you're having fevers.       ED Prescriptions   None    PDMP not reviewed this encounter.   Hazel Sams, PA-C 05/14/21 1251

## 2021-06-30 DIAGNOSIS — H0102A Squamous blepharitis right eye, upper and lower eyelids: Secondary | ICD-10-CM | POA: Diagnosis not present

## 2021-06-30 DIAGNOSIS — H26493 Other secondary cataract, bilateral: Secondary | ICD-10-CM | POA: Diagnosis not present

## 2021-06-30 DIAGNOSIS — H16223 Keratoconjunctivitis sicca, not specified as Sjogren's, bilateral: Secondary | ICD-10-CM | POA: Diagnosis not present

## 2021-06-30 DIAGNOSIS — H18592 Other hereditary corneal dystrophies, left eye: Secondary | ICD-10-CM | POA: Diagnosis not present

## 2021-07-12 DIAGNOSIS — L218 Other seborrheic dermatitis: Secondary | ICD-10-CM | POA: Diagnosis not present

## 2021-07-12 DIAGNOSIS — L821 Other seborrheic keratosis: Secondary | ICD-10-CM | POA: Diagnosis not present

## 2021-07-12 DIAGNOSIS — L918 Other hypertrophic disorders of the skin: Secondary | ICD-10-CM | POA: Diagnosis not present

## 2021-07-12 DIAGNOSIS — D1801 Hemangioma of skin and subcutaneous tissue: Secondary | ICD-10-CM | POA: Diagnosis not present

## 2021-07-12 DIAGNOSIS — L820 Inflamed seborrheic keratosis: Secondary | ICD-10-CM | POA: Diagnosis not present

## 2021-07-27 DIAGNOSIS — H0102A Squamous blepharitis right eye, upper and lower eyelids: Secondary | ICD-10-CM | POA: Diagnosis not present

## 2021-07-27 DIAGNOSIS — H00021 Hordeolum internum right upper eyelid: Secondary | ICD-10-CM | POA: Diagnosis not present

## 2021-07-27 DIAGNOSIS — H0288B Meibomian gland dysfunction left eye, upper and lower eyelids: Secondary | ICD-10-CM | POA: Diagnosis not present

## 2021-08-18 DIAGNOSIS — R7402 Elevation of levels of lactic acid dehydrogenase (LDH): Secondary | ICD-10-CM | POA: Diagnosis not present

## 2021-08-18 DIAGNOSIS — I7300 Raynaud's syndrome without gangrene: Secondary | ICD-10-CM | POA: Diagnosis not present

## 2021-08-18 DIAGNOSIS — E039 Hypothyroidism, unspecified: Secondary | ICD-10-CM | POA: Diagnosis not present

## 2021-08-18 DIAGNOSIS — E538 Deficiency of other specified B group vitamins: Secondary | ICD-10-CM | POA: Diagnosis not present

## 2021-08-18 DIAGNOSIS — E785 Hyperlipidemia, unspecified: Secondary | ICD-10-CM | POA: Diagnosis not present

## 2021-08-18 DIAGNOSIS — E663 Overweight: Secondary | ICD-10-CM | POA: Diagnosis not present

## 2021-08-18 DIAGNOSIS — R739 Hyperglycemia, unspecified: Secondary | ICD-10-CM | POA: Diagnosis not present

## 2021-08-18 DIAGNOSIS — M797 Fibromyalgia: Secondary | ICD-10-CM | POA: Diagnosis not present

## 2021-08-18 DIAGNOSIS — M8580 Other specified disorders of bone density and structure, unspecified site: Secondary | ICD-10-CM | POA: Diagnosis not present

## 2021-08-18 DIAGNOSIS — I10 Essential (primary) hypertension: Secondary | ICD-10-CM | POA: Diagnosis not present

## 2021-09-08 DIAGNOSIS — H60399 Other infective otitis externa, unspecified ear: Secondary | ICD-10-CM | POA: Diagnosis not present

## 2021-09-08 DIAGNOSIS — H903 Sensorineural hearing loss, bilateral: Secondary | ICD-10-CM | POA: Diagnosis not present

## 2021-09-08 DIAGNOSIS — H608X3 Other otitis externa, bilateral: Secondary | ICD-10-CM | POA: Diagnosis not present

## 2021-11-20 DIAGNOSIS — M1611 Unilateral primary osteoarthritis, right hip: Secondary | ICD-10-CM | POA: Diagnosis not present

## 2021-11-29 DIAGNOSIS — R208 Other disturbances of skin sensation: Secondary | ICD-10-CM | POA: Diagnosis not present

## 2021-11-29 DIAGNOSIS — R531 Weakness: Secondary | ICD-10-CM | POA: Diagnosis not present

## 2021-11-29 DIAGNOSIS — M25651 Stiffness of right hip, not elsewhere classified: Secondary | ICD-10-CM | POA: Diagnosis not present

## 2021-12-04 DIAGNOSIS — R531 Weakness: Secondary | ICD-10-CM | POA: Diagnosis not present

## 2021-12-04 DIAGNOSIS — M25651 Stiffness of right hip, not elsewhere classified: Secondary | ICD-10-CM | POA: Diagnosis not present

## 2021-12-04 DIAGNOSIS — R208 Other disturbances of skin sensation: Secondary | ICD-10-CM | POA: Diagnosis not present

## 2021-12-07 DIAGNOSIS — M25651 Stiffness of right hip, not elsewhere classified: Secondary | ICD-10-CM | POA: Diagnosis not present

## 2021-12-07 DIAGNOSIS — R531 Weakness: Secondary | ICD-10-CM | POA: Diagnosis not present

## 2021-12-07 DIAGNOSIS — R208 Other disturbances of skin sensation: Secondary | ICD-10-CM | POA: Diagnosis not present

## 2021-12-11 DIAGNOSIS — M25651 Stiffness of right hip, not elsewhere classified: Secondary | ICD-10-CM | POA: Diagnosis not present

## 2021-12-11 DIAGNOSIS — R208 Other disturbances of skin sensation: Secondary | ICD-10-CM | POA: Diagnosis not present

## 2021-12-11 DIAGNOSIS — R531 Weakness: Secondary | ICD-10-CM | POA: Diagnosis not present

## 2021-12-14 DIAGNOSIS — M25651 Stiffness of right hip, not elsewhere classified: Secondary | ICD-10-CM | POA: Diagnosis not present

## 2021-12-14 DIAGNOSIS — R531 Weakness: Secondary | ICD-10-CM | POA: Diagnosis not present

## 2021-12-14 DIAGNOSIS — R208 Other disturbances of skin sensation: Secondary | ICD-10-CM | POA: Diagnosis not present

## 2021-12-21 DIAGNOSIS — M25651 Stiffness of right hip, not elsewhere classified: Secondary | ICD-10-CM | POA: Diagnosis not present

## 2021-12-21 DIAGNOSIS — R531 Weakness: Secondary | ICD-10-CM | POA: Diagnosis not present

## 2021-12-21 DIAGNOSIS — R208 Other disturbances of skin sensation: Secondary | ICD-10-CM | POA: Diagnosis not present

## 2021-12-25 DIAGNOSIS — R531 Weakness: Secondary | ICD-10-CM | POA: Diagnosis not present

## 2021-12-25 DIAGNOSIS — R208 Other disturbances of skin sensation: Secondary | ICD-10-CM | POA: Diagnosis not present

## 2021-12-25 DIAGNOSIS — M25651 Stiffness of right hip, not elsewhere classified: Secondary | ICD-10-CM | POA: Diagnosis not present

## 2021-12-28 DIAGNOSIS — R531 Weakness: Secondary | ICD-10-CM | POA: Diagnosis not present

## 2021-12-28 DIAGNOSIS — H0102A Squamous blepharitis right eye, upper and lower eyelids: Secondary | ICD-10-CM | POA: Diagnosis not present

## 2021-12-28 DIAGNOSIS — H0288B Meibomian gland dysfunction left eye, upper and lower eyelids: Secondary | ICD-10-CM | POA: Diagnosis not present

## 2021-12-28 DIAGNOSIS — H26493 Other secondary cataract, bilateral: Secondary | ICD-10-CM | POA: Diagnosis not present

## 2021-12-28 DIAGNOSIS — M25651 Stiffness of right hip, not elsewhere classified: Secondary | ICD-10-CM | POA: Diagnosis not present

## 2021-12-28 DIAGNOSIS — R208 Other disturbances of skin sensation: Secondary | ICD-10-CM | POA: Diagnosis not present

## 2021-12-28 DIAGNOSIS — H18592 Other hereditary corneal dystrophies, left eye: Secondary | ICD-10-CM | POA: Diagnosis not present

## 2022-01-08 DIAGNOSIS — R208 Other disturbances of skin sensation: Secondary | ICD-10-CM | POA: Diagnosis not present

## 2022-01-08 DIAGNOSIS — R531 Weakness: Secondary | ICD-10-CM | POA: Diagnosis not present

## 2022-01-08 DIAGNOSIS — M25651 Stiffness of right hip, not elsewhere classified: Secondary | ICD-10-CM | POA: Diagnosis not present

## 2022-01-15 DIAGNOSIS — M25651 Stiffness of right hip, not elsewhere classified: Secondary | ICD-10-CM | POA: Diagnosis not present

## 2022-01-15 DIAGNOSIS — R208 Other disturbances of skin sensation: Secondary | ICD-10-CM | POA: Diagnosis not present

## 2022-01-15 DIAGNOSIS — R531 Weakness: Secondary | ICD-10-CM | POA: Diagnosis not present

## 2022-01-22 DIAGNOSIS — M25651 Stiffness of right hip, not elsewhere classified: Secondary | ICD-10-CM | POA: Diagnosis not present

## 2022-01-22 DIAGNOSIS — R208 Other disturbances of skin sensation: Secondary | ICD-10-CM | POA: Diagnosis not present

## 2022-01-22 DIAGNOSIS — H18592 Other hereditary corneal dystrophies, left eye: Secondary | ICD-10-CM | POA: Diagnosis not present

## 2022-01-22 DIAGNOSIS — H16223 Keratoconjunctivitis sicca, not specified as Sjogren's, bilateral: Secondary | ICD-10-CM | POA: Diagnosis not present

## 2022-01-22 DIAGNOSIS — M1611 Unilateral primary osteoarthritis, right hip: Secondary | ICD-10-CM | POA: Diagnosis not present

## 2022-01-22 DIAGNOSIS — R531 Weakness: Secondary | ICD-10-CM | POA: Diagnosis not present

## 2022-01-29 DIAGNOSIS — R531 Weakness: Secondary | ICD-10-CM | POA: Diagnosis not present

## 2022-01-29 DIAGNOSIS — R208 Other disturbances of skin sensation: Secondary | ICD-10-CM | POA: Diagnosis not present

## 2022-01-29 DIAGNOSIS — M25651 Stiffness of right hip, not elsewhere classified: Secondary | ICD-10-CM | POA: Diagnosis not present

## 2022-02-05 DIAGNOSIS — D2272 Melanocytic nevi of left lower limb, including hip: Secondary | ICD-10-CM | POA: Diagnosis not present

## 2022-02-05 DIAGNOSIS — R208 Other disturbances of skin sensation: Secondary | ICD-10-CM | POA: Diagnosis not present

## 2022-02-05 DIAGNOSIS — L821 Other seborrheic keratosis: Secondary | ICD-10-CM | POA: Diagnosis not present

## 2022-02-05 DIAGNOSIS — M25651 Stiffness of right hip, not elsewhere classified: Secondary | ICD-10-CM | POA: Diagnosis not present

## 2022-02-05 DIAGNOSIS — R531 Weakness: Secondary | ICD-10-CM | POA: Diagnosis not present

## 2022-02-05 DIAGNOSIS — D485 Neoplasm of uncertain behavior of skin: Secondary | ICD-10-CM | POA: Diagnosis not present

## 2022-02-05 DIAGNOSIS — D2271 Melanocytic nevi of right lower limb, including hip: Secondary | ICD-10-CM | POA: Diagnosis not present

## 2022-02-05 DIAGNOSIS — D225 Melanocytic nevi of trunk: Secondary | ICD-10-CM | POA: Diagnosis not present

## 2022-02-05 DIAGNOSIS — D1801 Hemangioma of skin and subcutaneous tissue: Secondary | ICD-10-CM | POA: Diagnosis not present

## 2022-02-05 DIAGNOSIS — L218 Other seborrheic dermatitis: Secondary | ICD-10-CM | POA: Diagnosis not present

## 2022-02-05 DIAGNOSIS — L910 Hypertrophic scar: Secondary | ICD-10-CM | POA: Diagnosis not present

## 2022-02-08 DIAGNOSIS — H0288B Meibomian gland dysfunction left eye, upper and lower eyelids: Secondary | ICD-10-CM | POA: Diagnosis not present

## 2022-02-08 DIAGNOSIS — H0102A Squamous blepharitis right eye, upper and lower eyelids: Secondary | ICD-10-CM | POA: Diagnosis not present

## 2022-02-08 DIAGNOSIS — H18592 Other hereditary corneal dystrophies, left eye: Secondary | ICD-10-CM | POA: Diagnosis not present

## 2022-02-08 DIAGNOSIS — H18832 Recurrent erosion of cornea, left eye: Secondary | ICD-10-CM | POA: Diagnosis not present

## 2022-02-08 DIAGNOSIS — H16223 Keratoconjunctivitis sicca, not specified as Sjogren's, bilateral: Secondary | ICD-10-CM | POA: Diagnosis not present

## 2022-02-15 DIAGNOSIS — E785 Hyperlipidemia, unspecified: Secondary | ICD-10-CM | POA: Diagnosis not present

## 2022-02-15 DIAGNOSIS — I10 Essential (primary) hypertension: Secondary | ICD-10-CM | POA: Diagnosis not present

## 2022-02-15 DIAGNOSIS — Z0000 Encounter for general adult medical examination without abnormal findings: Secondary | ICD-10-CM | POA: Diagnosis not present

## 2022-02-15 DIAGNOSIS — M8580 Other specified disorders of bone density and structure, unspecified site: Secondary | ICD-10-CM | POA: Diagnosis not present

## 2022-02-15 DIAGNOSIS — R7989 Other specified abnormal findings of blood chemistry: Secondary | ICD-10-CM | POA: Diagnosis not present

## 2022-02-15 DIAGNOSIS — E039 Hypothyroidism, unspecified: Secondary | ICD-10-CM | POA: Diagnosis not present

## 2022-02-15 DIAGNOSIS — R739 Hyperglycemia, unspecified: Secondary | ICD-10-CM | POA: Diagnosis not present

## 2022-02-15 DIAGNOSIS — E538 Deficiency of other specified B group vitamins: Secondary | ICD-10-CM | POA: Diagnosis not present

## 2022-02-18 DIAGNOSIS — Z0000 Encounter for general adult medical examination without abnormal findings: Secondary | ICD-10-CM | POA: Diagnosis not present

## 2022-02-18 DIAGNOSIS — E538 Deficiency of other specified B group vitamins: Secondary | ICD-10-CM | POA: Diagnosis not present

## 2022-02-18 DIAGNOSIS — M8580 Other specified disorders of bone density and structure, unspecified site: Secondary | ICD-10-CM | POA: Diagnosis not present

## 2022-02-19 DIAGNOSIS — R208 Other disturbances of skin sensation: Secondary | ICD-10-CM | POA: Diagnosis not present

## 2022-02-19 DIAGNOSIS — M25651 Stiffness of right hip, not elsewhere classified: Secondary | ICD-10-CM | POA: Diagnosis not present

## 2022-02-19 DIAGNOSIS — R531 Weakness: Secondary | ICD-10-CM | POA: Diagnosis not present

## 2022-02-22 DIAGNOSIS — I7300 Raynaud's syndrome without gangrene: Secondary | ICD-10-CM | POA: Diagnosis not present

## 2022-02-22 DIAGNOSIS — R059 Cough, unspecified: Secondary | ICD-10-CM | POA: Diagnosis not present

## 2022-02-22 DIAGNOSIS — R82998 Other abnormal findings in urine: Secondary | ICD-10-CM | POA: Diagnosis not present

## 2022-02-22 DIAGNOSIS — R7402 Elevation of levels of lactic acid dehydrogenase (LDH): Secondary | ICD-10-CM | POA: Diagnosis not present

## 2022-02-22 DIAGNOSIS — E785 Hyperlipidemia, unspecified: Secondary | ICD-10-CM | POA: Diagnosis not present

## 2022-02-22 DIAGNOSIS — Z0000 Encounter for general adult medical examination without abnormal findings: Secondary | ICD-10-CM | POA: Diagnosis not present

## 2022-02-22 DIAGNOSIS — M797 Fibromyalgia: Secondary | ICD-10-CM | POA: Diagnosis not present

## 2022-02-22 DIAGNOSIS — E039 Hypothyroidism, unspecified: Secondary | ICD-10-CM | POA: Diagnosis not present

## 2022-02-22 DIAGNOSIS — M8580 Other specified disorders of bone density and structure, unspecified site: Secondary | ICD-10-CM | POA: Diagnosis not present

## 2022-02-22 DIAGNOSIS — M25559 Pain in unspecified hip: Secondary | ICD-10-CM | POA: Diagnosis not present

## 2022-02-22 DIAGNOSIS — I10 Essential (primary) hypertension: Secondary | ICD-10-CM | POA: Diagnosis not present

## 2022-02-22 DIAGNOSIS — F325 Major depressive disorder, single episode, in full remission: Secondary | ICD-10-CM | POA: Diagnosis not present

## 2022-02-22 DIAGNOSIS — E538 Deficiency of other specified B group vitamins: Secondary | ICD-10-CM | POA: Diagnosis not present

## 2022-02-23 ENCOUNTER — Other Ambulatory Visit: Payer: Self-pay | Admitting: Internal Medicine

## 2022-02-23 DIAGNOSIS — Z1231 Encounter for screening mammogram for malignant neoplasm of breast: Secondary | ICD-10-CM

## 2022-03-12 DIAGNOSIS — H16223 Keratoconjunctivitis sicca, not specified as Sjogren's, bilateral: Secondary | ICD-10-CM | POA: Diagnosis not present

## 2022-03-12 DIAGNOSIS — H18832 Recurrent erosion of cornea, left eye: Secondary | ICD-10-CM | POA: Diagnosis not present

## 2022-03-12 DIAGNOSIS — H5711 Ocular pain, right eye: Secondary | ICD-10-CM | POA: Diagnosis not present

## 2022-03-28 ENCOUNTER — Ambulatory Visit
Admission: RE | Admit: 2022-03-28 | Discharge: 2022-03-28 | Disposition: A | Discharge status: Home / Self Care | Payer: PPO | Source: Ambulatory Visit | Attending: Internal Medicine | Admitting: Internal Medicine

## 2022-03-28 DIAGNOSIS — Z1231 Encounter for screening mammogram for malignant neoplasm of breast: Secondary | ICD-10-CM

## 2022-04-16 DIAGNOSIS — I10 Essential (primary) hypertension: Secondary | ICD-10-CM | POA: Diagnosis not present

## 2022-04-16 DIAGNOSIS — J0100 Acute maxillary sinusitis, unspecified: Secondary | ICD-10-CM | POA: Diagnosis not present

## 2022-04-16 DIAGNOSIS — J029 Acute pharyngitis, unspecified: Secondary | ICD-10-CM | POA: Diagnosis not present

## 2022-09-14 DIAGNOSIS — H00021 Hordeolum internum right upper eyelid: Secondary | ICD-10-CM | POA: Diagnosis not present

## 2022-09-26 DIAGNOSIS — H16223 Keratoconjunctivitis sicca, not specified as Sjogren's, bilateral: Secondary | ICD-10-CM | POA: Diagnosis not present

## 2022-09-26 DIAGNOSIS — H00021 Hordeolum internum right upper eyelid: Secondary | ICD-10-CM | POA: Diagnosis not present

## 2022-09-26 DIAGNOSIS — H18832 Recurrent erosion of cornea, left eye: Secondary | ICD-10-CM | POA: Diagnosis not present

## 2022-09-26 DIAGNOSIS — H0102B Squamous blepharitis left eye, upper and lower eyelids: Secondary | ICD-10-CM | POA: Diagnosis not present

## 2022-09-27 DIAGNOSIS — E538 Deficiency of other specified B group vitamins: Secondary | ICD-10-CM | POA: Diagnosis not present

## 2022-09-27 DIAGNOSIS — M797 Fibromyalgia: Secondary | ICD-10-CM | POA: Diagnosis not present

## 2022-09-27 DIAGNOSIS — K219 Gastro-esophageal reflux disease without esophagitis: Secondary | ICD-10-CM | POA: Diagnosis not present

## 2022-09-27 DIAGNOSIS — R739 Hyperglycemia, unspecified: Secondary | ICD-10-CM | POA: Diagnosis not present

## 2022-09-27 DIAGNOSIS — R7989 Other specified abnormal findings of blood chemistry: Secondary | ICD-10-CM | POA: Diagnosis not present

## 2022-09-27 DIAGNOSIS — M25559 Pain in unspecified hip: Secondary | ICD-10-CM | POA: Diagnosis not present

## 2022-09-27 DIAGNOSIS — E039 Hypothyroidism, unspecified: Secondary | ICD-10-CM | POA: Diagnosis not present

## 2022-09-27 DIAGNOSIS — I10 Essential (primary) hypertension: Secondary | ICD-10-CM | POA: Diagnosis not present

## 2022-09-27 DIAGNOSIS — G43109 Migraine with aura, not intractable, without status migrainosus: Secondary | ICD-10-CM | POA: Diagnosis not present

## 2022-09-27 DIAGNOSIS — I7300 Raynaud's syndrome without gangrene: Secondary | ICD-10-CM | POA: Diagnosis not present

## 2022-09-27 DIAGNOSIS — E785 Hyperlipidemia, unspecified: Secondary | ICD-10-CM | POA: Diagnosis not present

## 2022-09-27 DIAGNOSIS — M8580 Other specified disorders of bone density and structure, unspecified site: Secondary | ICD-10-CM | POA: Diagnosis not present

## 2022-10-01 DIAGNOSIS — H5711 Ocular pain, right eye: Secondary | ICD-10-CM | POA: Diagnosis not present

## 2022-10-01 DIAGNOSIS — H18592 Other hereditary corneal dystrophies, left eye: Secondary | ICD-10-CM | POA: Diagnosis not present

## 2022-10-01 DIAGNOSIS — H00021 Hordeolum internum right upper eyelid: Secondary | ICD-10-CM | POA: Diagnosis not present

## 2022-10-30 DIAGNOSIS — L300 Nummular dermatitis: Secondary | ICD-10-CM | POA: Diagnosis not present

## 2022-10-30 DIAGNOSIS — L820 Inflamed seborrheic keratosis: Secondary | ICD-10-CM | POA: Diagnosis not present

## 2022-10-30 DIAGNOSIS — L281 Prurigo nodularis: Secondary | ICD-10-CM | POA: Diagnosis not present

## 2022-11-07 DIAGNOSIS — S20362A Insect bite (nonvenomous) of left front wall of thorax, initial encounter: Secondary | ICD-10-CM | POA: Diagnosis not present

## 2022-11-29 DIAGNOSIS — H18832 Recurrent erosion of cornea, left eye: Secondary | ICD-10-CM | POA: Diagnosis not present

## 2022-11-29 DIAGNOSIS — H5711 Ocular pain, right eye: Secondary | ICD-10-CM | POA: Diagnosis not present

## 2022-11-29 DIAGNOSIS — H18592 Other hereditary corneal dystrophies, left eye: Secondary | ICD-10-CM | POA: Diagnosis not present

## 2022-12-17 DIAGNOSIS — H18592 Other hereditary corneal dystrophies, left eye: Secondary | ICD-10-CM | POA: Diagnosis not present

## 2022-12-17 DIAGNOSIS — H16223 Keratoconjunctivitis sicca, not specified as Sjogren's, bilateral: Secondary | ICD-10-CM | POA: Diagnosis not present

## 2022-12-21 DIAGNOSIS — H16223 Keratoconjunctivitis sicca, not specified as Sjogren's, bilateral: Secondary | ICD-10-CM | POA: Diagnosis not present

## 2022-12-21 DIAGNOSIS — H18832 Recurrent erosion of cornea, left eye: Secondary | ICD-10-CM | POA: Diagnosis not present

## 2022-12-21 DIAGNOSIS — H18592 Other hereditary corneal dystrophies, left eye: Secondary | ICD-10-CM | POA: Diagnosis not present

## 2022-12-21 DIAGNOSIS — H0102B Squamous blepharitis left eye, upper and lower eyelids: Secondary | ICD-10-CM | POA: Diagnosis not present

## 2023-01-03 DIAGNOSIS — L738 Other specified follicular disorders: Secondary | ICD-10-CM | POA: Diagnosis not present

## 2023-01-03 DIAGNOSIS — Z961 Presence of intraocular lens: Secondary | ICD-10-CM | POA: Diagnosis not present

## 2023-01-03 DIAGNOSIS — H18592 Other hereditary corneal dystrophies, left eye: Secondary | ICD-10-CM | POA: Diagnosis not present

## 2023-01-03 DIAGNOSIS — H16223 Keratoconjunctivitis sicca, not specified as Sjogren's, bilateral: Secondary | ICD-10-CM | POA: Diagnosis not present

## 2023-01-03 DIAGNOSIS — H26493 Other secondary cataract, bilateral: Secondary | ICD-10-CM | POA: Diagnosis not present

## 2023-01-31 DIAGNOSIS — J029 Acute pharyngitis, unspecified: Secondary | ICD-10-CM | POA: Diagnosis not present

## 2023-01-31 DIAGNOSIS — R0981 Nasal congestion: Secondary | ICD-10-CM | POA: Diagnosis not present

## 2023-01-31 DIAGNOSIS — R059 Cough, unspecified: Secondary | ICD-10-CM | POA: Diagnosis not present

## 2023-01-31 DIAGNOSIS — I10 Essential (primary) hypertension: Secondary | ICD-10-CM | POA: Diagnosis not present

## 2023-01-31 DIAGNOSIS — Z1152 Encounter for screening for COVID-19: Secondary | ICD-10-CM | POA: Diagnosis not present

## 2023-01-31 DIAGNOSIS — U071 COVID-19: Secondary | ICD-10-CM | POA: Diagnosis not present

## 2023-02-14 DIAGNOSIS — D225 Melanocytic nevi of trunk: Secondary | ICD-10-CM | POA: Diagnosis not present

## 2023-02-14 DIAGNOSIS — D1801 Hemangioma of skin and subcutaneous tissue: Secondary | ICD-10-CM | POA: Diagnosis not present

## 2023-02-14 DIAGNOSIS — L820 Inflamed seborrheic keratosis: Secondary | ICD-10-CM | POA: Diagnosis not present

## 2023-02-14 DIAGNOSIS — L821 Other seborrheic keratosis: Secondary | ICD-10-CM | POA: Diagnosis not present

## 2023-02-20 DIAGNOSIS — E039 Hypothyroidism, unspecified: Secondary | ICD-10-CM | POA: Diagnosis not present

## 2023-02-20 DIAGNOSIS — E538 Deficiency of other specified B group vitamins: Secondary | ICD-10-CM | POA: Diagnosis not present

## 2023-02-20 DIAGNOSIS — M8580 Other specified disorders of bone density and structure, unspecified site: Secondary | ICD-10-CM | POA: Diagnosis not present

## 2023-02-20 DIAGNOSIS — E875 Hyperkalemia: Secondary | ICD-10-CM | POA: Diagnosis not present

## 2023-02-20 DIAGNOSIS — E785 Hyperlipidemia, unspecified: Secondary | ICD-10-CM | POA: Diagnosis not present

## 2023-02-20 DIAGNOSIS — R739 Hyperglycemia, unspecified: Secondary | ICD-10-CM | POA: Diagnosis not present

## 2023-02-20 DIAGNOSIS — I10 Essential (primary) hypertension: Secondary | ICD-10-CM | POA: Diagnosis not present

## 2023-02-20 DIAGNOSIS — Z1212 Encounter for screening for malignant neoplasm of rectum: Secondary | ICD-10-CM | POA: Diagnosis not present

## 2023-02-22 ENCOUNTER — Other Ambulatory Visit: Payer: Self-pay | Admitting: Internal Medicine

## 2023-02-22 DIAGNOSIS — Z0000 Encounter for general adult medical examination without abnormal findings: Secondary | ICD-10-CM

## 2023-02-25 DIAGNOSIS — Z0000 Encounter for general adult medical examination without abnormal findings: Secondary | ICD-10-CM | POA: Diagnosis not present

## 2023-02-25 DIAGNOSIS — Z23 Encounter for immunization: Secondary | ICD-10-CM | POA: Diagnosis not present

## 2023-02-25 DIAGNOSIS — G43109 Migraine with aura, not intractable, without status migrainosus: Secondary | ICD-10-CM | POA: Diagnosis not present

## 2023-02-25 DIAGNOSIS — F325 Major depressive disorder, single episode, in full remission: Secondary | ICD-10-CM | POA: Diagnosis not present

## 2023-02-25 DIAGNOSIS — E785 Hyperlipidemia, unspecified: Secondary | ICD-10-CM | POA: Diagnosis not present

## 2023-02-25 DIAGNOSIS — H8110 Benign paroxysmal vertigo, unspecified ear: Secondary | ICD-10-CM | POA: Diagnosis not present

## 2023-02-25 DIAGNOSIS — I10 Essential (primary) hypertension: Secondary | ICD-10-CM | POA: Diagnosis not present

## 2023-02-25 DIAGNOSIS — M8580 Other specified disorders of bone density and structure, unspecified site: Secondary | ICD-10-CM | POA: Diagnosis not present

## 2023-02-25 DIAGNOSIS — D239 Other benign neoplasm of skin, unspecified: Secondary | ICD-10-CM | POA: Diagnosis not present

## 2023-02-25 DIAGNOSIS — E538 Deficiency of other specified B group vitamins: Secondary | ICD-10-CM | POA: Diagnosis not present

## 2023-02-25 DIAGNOSIS — R7402 Elevation of levels of lactic acid dehydrogenase (LDH): Secondary | ICD-10-CM | POA: Diagnosis not present

## 2023-02-25 DIAGNOSIS — R82998 Other abnormal findings in urine: Secondary | ICD-10-CM | POA: Diagnosis not present

## 2023-02-25 DIAGNOSIS — E039 Hypothyroidism, unspecified: Secondary | ICD-10-CM | POA: Diagnosis not present

## 2023-04-02 ENCOUNTER — Ambulatory Visit
Admission: RE | Admit: 2023-04-02 | Discharge: 2023-04-02 | Disposition: A | Discharge status: Home / Self Care | Payer: PPO | Source: Ambulatory Visit | Attending: Internal Medicine | Admitting: Internal Medicine

## 2023-04-02 DIAGNOSIS — Z0000 Encounter for general adult medical examination without abnormal findings: Secondary | ICD-10-CM

## 2023-04-02 DIAGNOSIS — Z1231 Encounter for screening mammogram for malignant neoplasm of breast: Secondary | ICD-10-CM | POA: Diagnosis not present

## 2023-04-08 IMAGING — MG MM DIGITAL SCREENING BILAT W/ TOMO AND CAD
8 series · 8 of 24 positions shown · non-contrast
Comparison: Previous exam(s).

CLINICAL DATA: Screening.

EXAM:
DIGITAL SCREENING BILATERAL MAMMOGRAM WITH TOMOSYNTHESIS AND CAD
TECHNIQUE: Bilateral screening digital craniocaudal and mediolateral oblique
mammograms were obtained. Bilateral screening digital breast
tomosynthesis was performed. The images were evaluated with
computer-aided detection.

[R CC synth-2D]
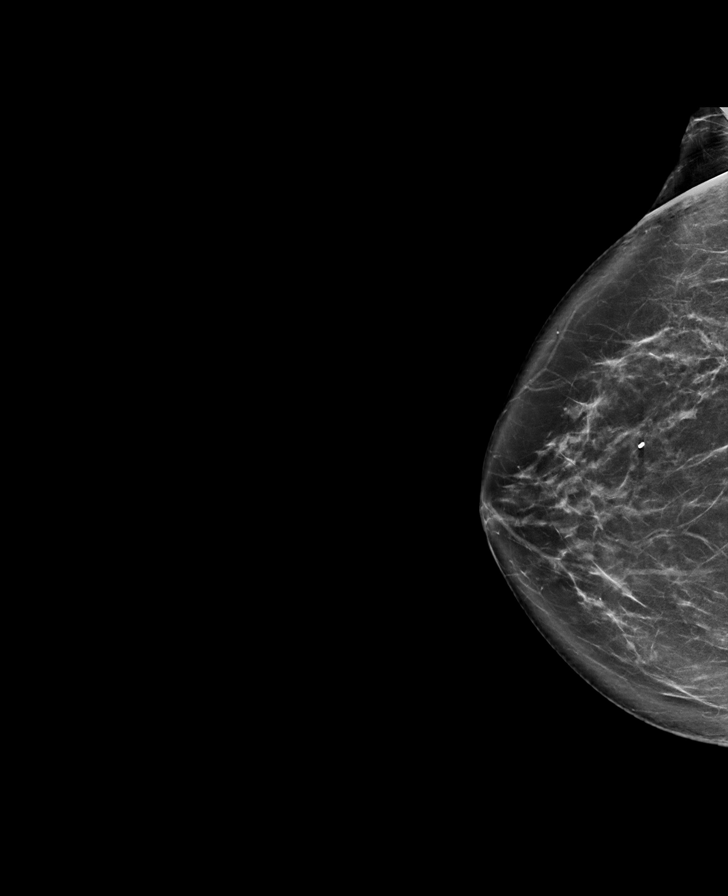

[R MLO synth-2D]
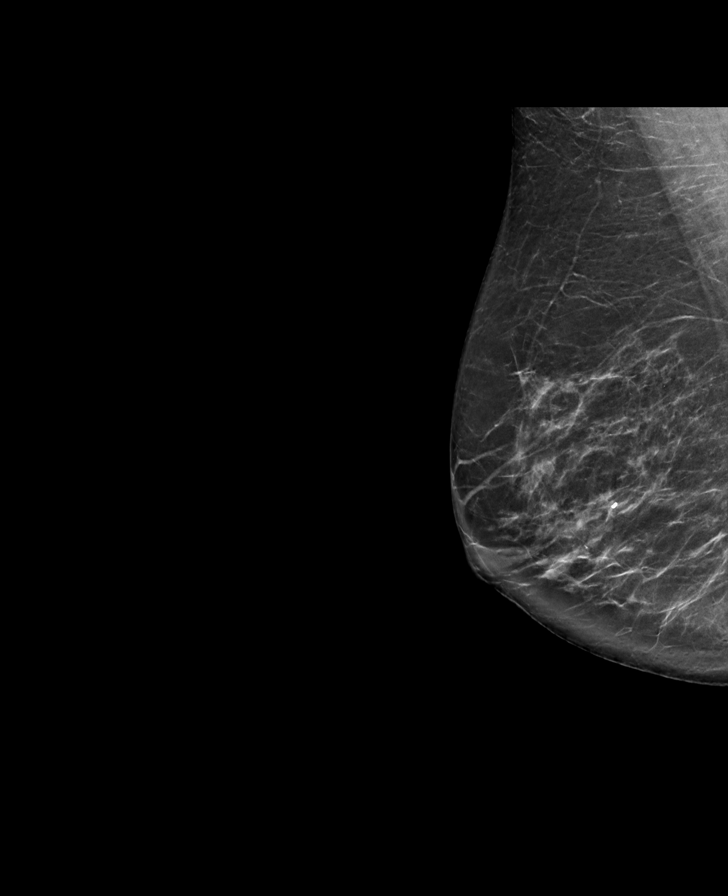

[L CC synth-2D]
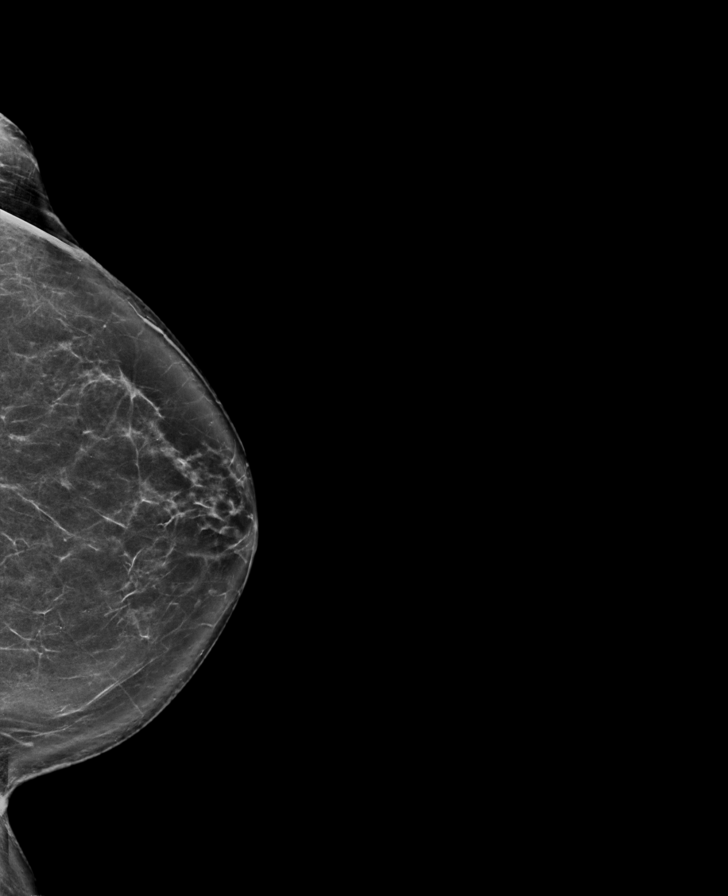

[L MLO synth-2D]
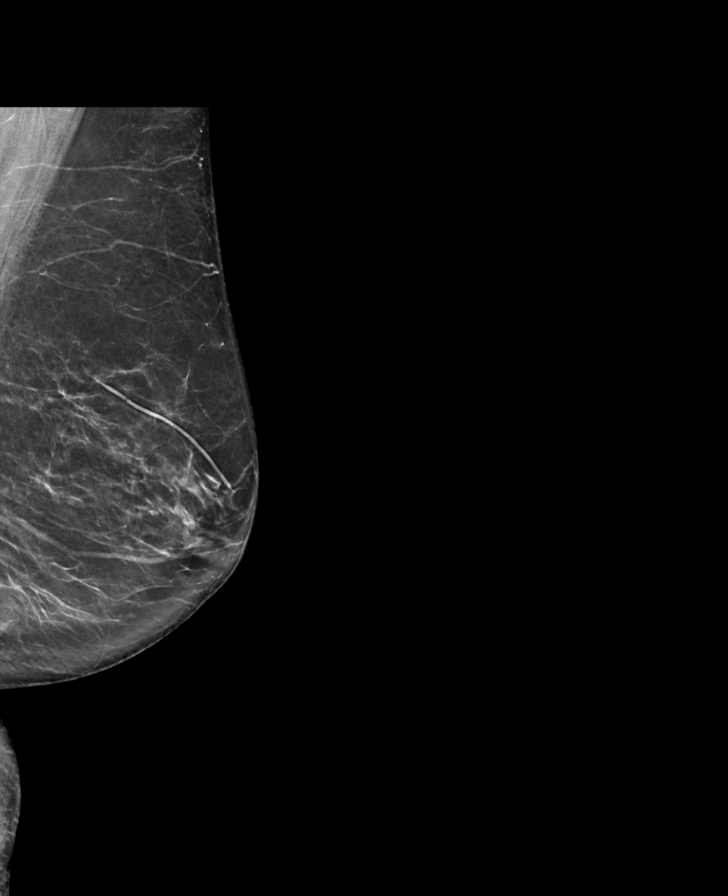

[L MLO tomo · tomo slice 39/76.0]
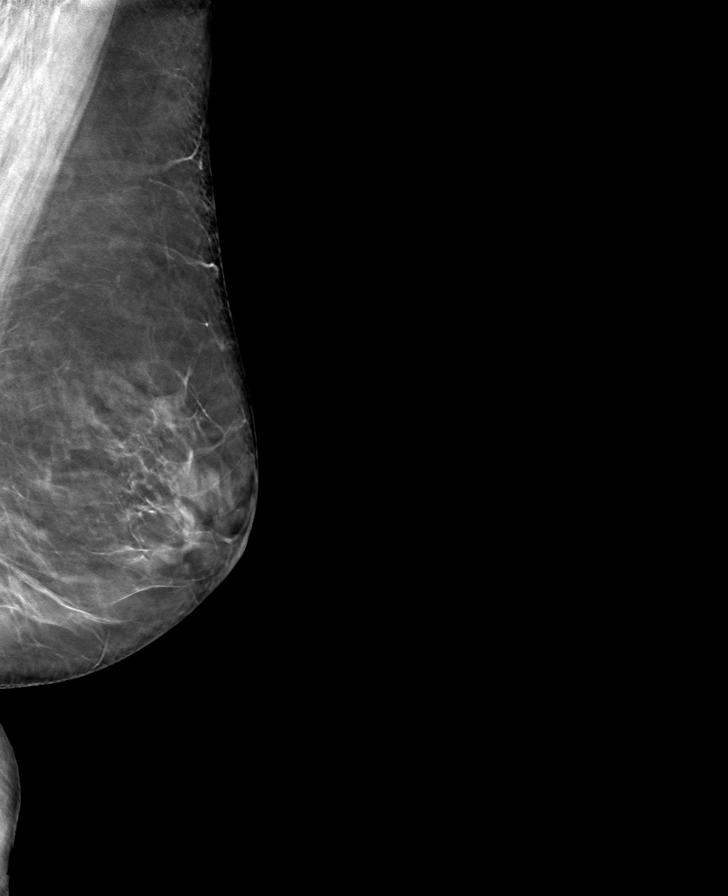

[R MLO tomo · tomo slice 39/78.0]
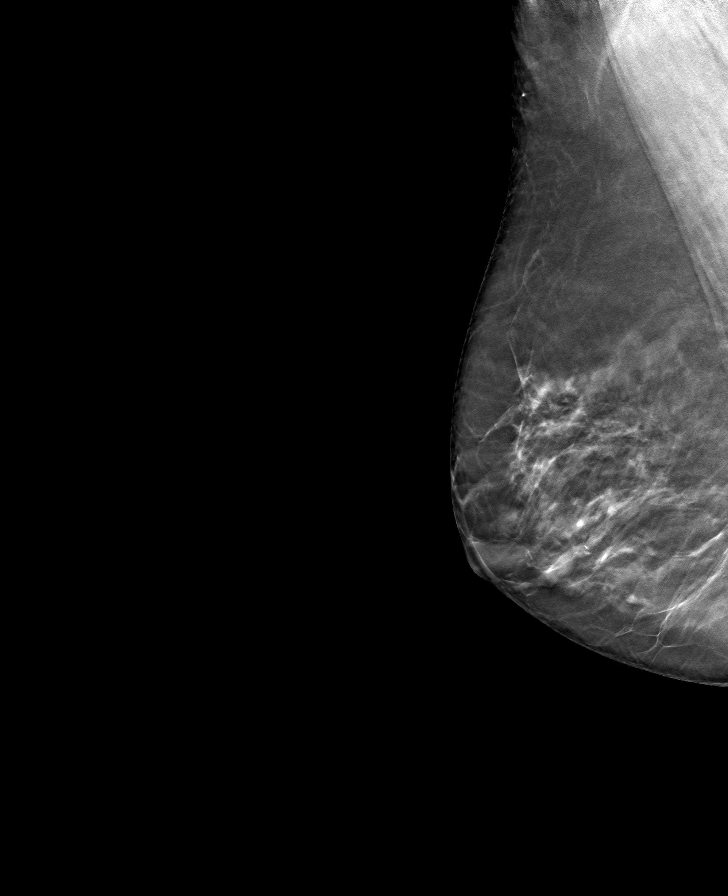

[L CC tomo · tomo slice 39/78.0]
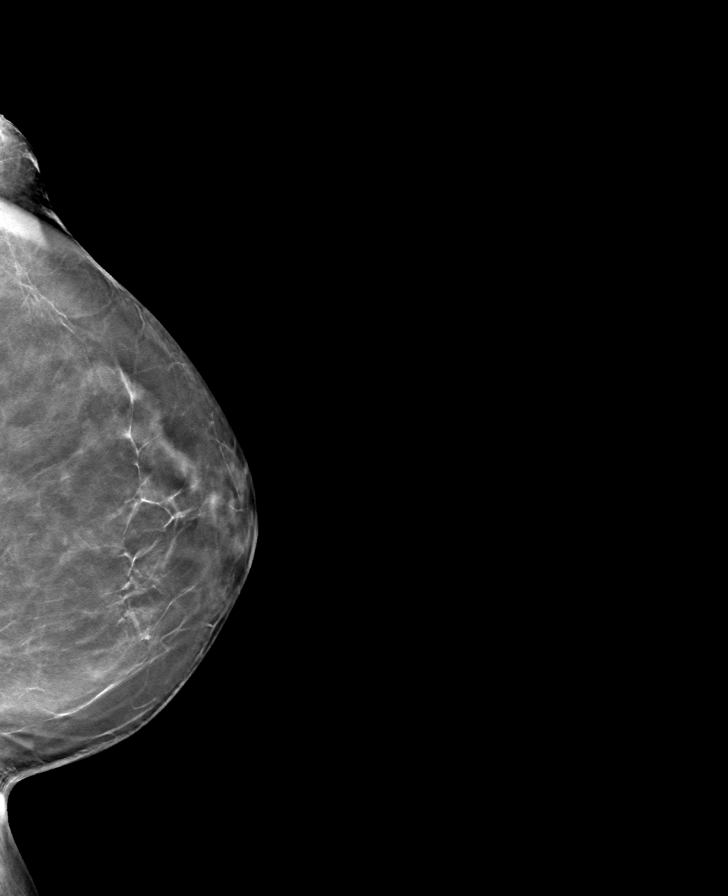

[R CC tomo · tomo slice 41/82.0]
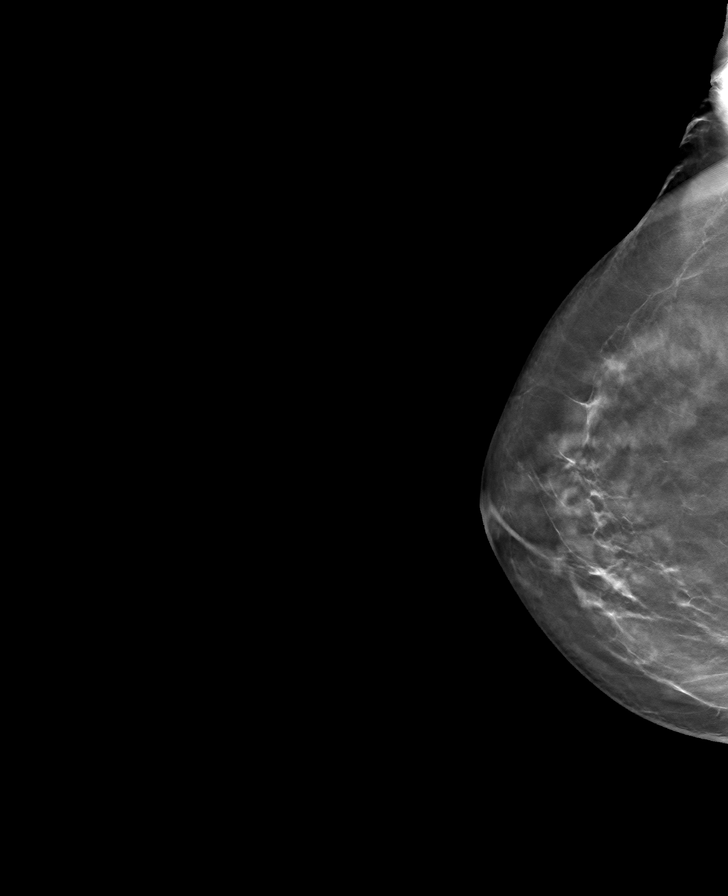

[8 of 24 positions shown; findings below may reference images not displayed]

ACR Breast Density Category b: There are scattered areas of
fibroglandular density.
FINDINGS: There are no findings suspicious for malignancy.
IMPRESSION: No mammographic evidence of malignancy. A result letter of this
screening mammogram will be mailed directly to the patient.

RECOMMENDATION:
Screening mammogram in one year. (Code:51-O-LD2)

BI-RADS CATEGORY  1: Negative.

## 2023-04-17 DIAGNOSIS — H16223 Keratoconjunctivitis sicca, not specified as Sjogren's, bilateral: Secondary | ICD-10-CM | POA: Diagnosis not present

## 2023-04-17 DIAGNOSIS — H18832 Recurrent erosion of cornea, left eye: Secondary | ICD-10-CM | POA: Diagnosis not present

## 2023-04-17 DIAGNOSIS — H18592 Other hereditary corneal dystrophies, left eye: Secondary | ICD-10-CM | POA: Diagnosis not present

## 2023-04-17 DIAGNOSIS — H0102B Squamous blepharitis left eye, upper and lower eyelids: Secondary | ICD-10-CM | POA: Diagnosis not present

## 2023-05-26 IMAGING — DX DG CHEST 2V
2 series · 2 of 2 positions shown · non-contrast
Comparison: None.

CLINICAL DATA: Cough and congestion.

EXAM:
CHEST - 2 VIEW

[chest pa]
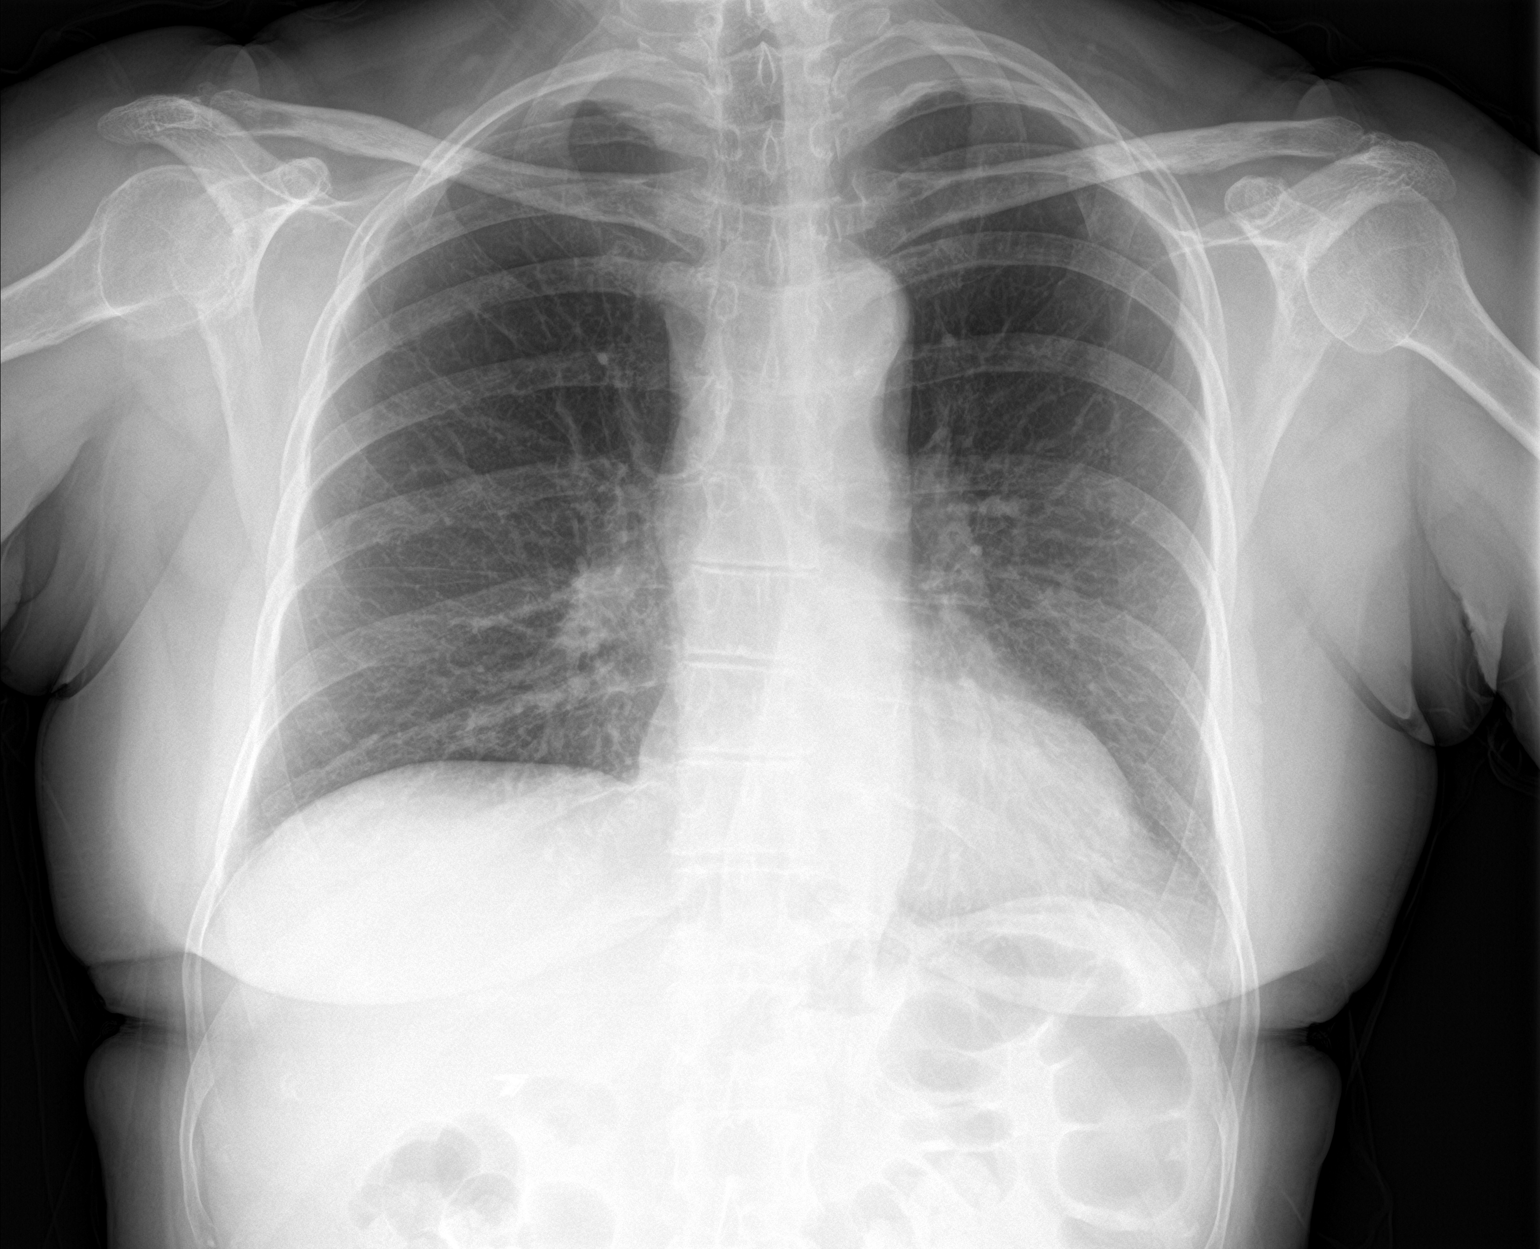

[chest lat]
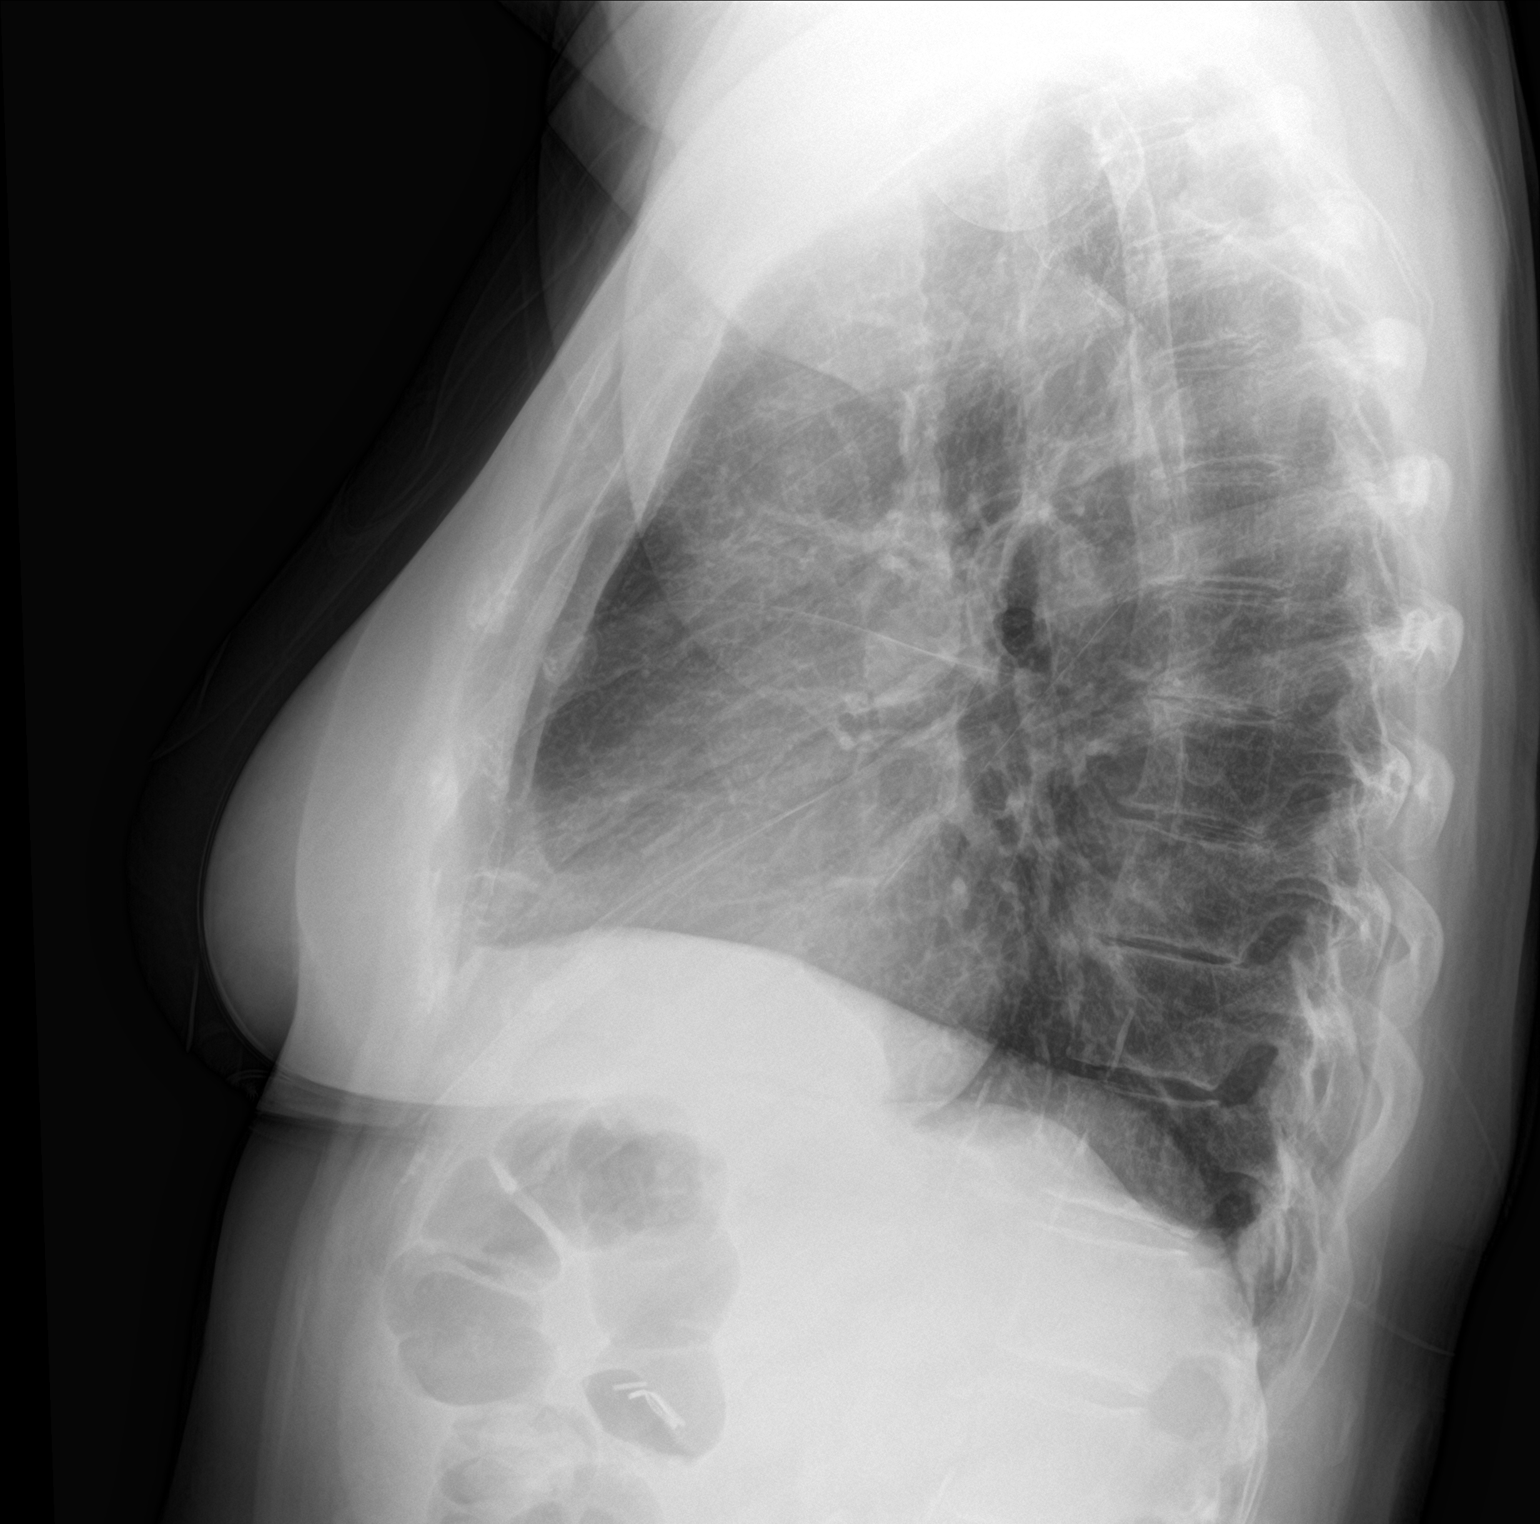

[2 of 2 positions shown; findings below may reference images not displayed]

FINDINGS: Heart size and mediastinal contours are within normal limits. Lungs
are clear. No pleural effusion. Osseous structures about the chest
are unremarkable.
IMPRESSION: No active cardiopulmonary disease.  No evidence of pneumonia.

## 2023-07-05 DIAGNOSIS — I10 Essential (primary) hypertension: Secondary | ICD-10-CM | POA: Diagnosis not present

## 2023-07-05 DIAGNOSIS — J019 Acute sinusitis, unspecified: Secondary | ICD-10-CM | POA: Diagnosis not present

## 2023-07-05 DIAGNOSIS — R5383 Other fatigue: Secondary | ICD-10-CM | POA: Diagnosis not present

## 2023-07-05 DIAGNOSIS — R42 Dizziness and giddiness: Secondary | ICD-10-CM | POA: Diagnosis not present

## 2023-07-05 DIAGNOSIS — R051 Acute cough: Secondary | ICD-10-CM | POA: Diagnosis not present

## 2023-07-05 DIAGNOSIS — Z1152 Encounter for screening for COVID-19: Secondary | ICD-10-CM | POA: Diagnosis not present

## 2023-07-10 DIAGNOSIS — H0102A Squamous blepharitis right eye, upper and lower eyelids: Secondary | ICD-10-CM | POA: Diagnosis not present

## 2023-07-10 DIAGNOSIS — H16223 Keratoconjunctivitis sicca, not specified as Sjogren's, bilateral: Secondary | ICD-10-CM | POA: Diagnosis not present

## 2023-07-10 DIAGNOSIS — H0102B Squamous blepharitis left eye, upper and lower eyelids: Secondary | ICD-10-CM | POA: Diagnosis not present

## 2023-08-29 DIAGNOSIS — M25559 Pain in unspecified hip: Secondary | ICD-10-CM | POA: Diagnosis not present

## 2023-08-29 DIAGNOSIS — L309 Dermatitis, unspecified: Secondary | ICD-10-CM | POA: Diagnosis not present

## 2023-08-29 DIAGNOSIS — F325 Major depressive disorder, single episode, in full remission: Secondary | ICD-10-CM | POA: Diagnosis not present

## 2023-08-29 DIAGNOSIS — M26609 Unspecified temporomandibular joint disorder, unspecified side: Secondary | ICD-10-CM | POA: Diagnosis not present

## 2023-08-29 DIAGNOSIS — E663 Overweight: Secondary | ICD-10-CM | POA: Diagnosis not present

## 2023-08-29 DIAGNOSIS — E039 Hypothyroidism, unspecified: Secondary | ICD-10-CM | POA: Diagnosis not present

## 2023-08-29 DIAGNOSIS — M797 Fibromyalgia: Secondary | ICD-10-CM | POA: Diagnosis not present

## 2023-08-29 DIAGNOSIS — K649 Unspecified hemorrhoids: Secondary | ICD-10-CM | POA: Diagnosis not present

## 2023-08-29 DIAGNOSIS — K219 Gastro-esophageal reflux disease without esophagitis: Secondary | ICD-10-CM | POA: Diagnosis not present

## 2023-08-29 DIAGNOSIS — I10 Essential (primary) hypertension: Secondary | ICD-10-CM | POA: Diagnosis not present

## 2023-08-29 DIAGNOSIS — M8580 Other specified disorders of bone density and structure, unspecified site: Secondary | ICD-10-CM | POA: Diagnosis not present

## 2023-08-29 DIAGNOSIS — E785 Hyperlipidemia, unspecified: Secondary | ICD-10-CM | POA: Diagnosis not present

## 2023-09-10 DIAGNOSIS — M8580 Other specified disorders of bone density and structure, unspecified site: Secondary | ICD-10-CM | POA: Diagnosis not present

## 2023-09-17 DIAGNOSIS — L820 Inflamed seborrheic keratosis: Secondary | ICD-10-CM | POA: Diagnosis not present

## 2023-09-17 DIAGNOSIS — L309 Dermatitis, unspecified: Secondary | ICD-10-CM | POA: Diagnosis not present

## 2024-01-15 DIAGNOSIS — H26493 Other secondary cataract, bilateral: Secondary | ICD-10-CM | POA: Diagnosis not present

## 2024-01-15 DIAGNOSIS — H18832 Recurrent erosion of cornea, left eye: Secondary | ICD-10-CM | POA: Diagnosis not present

## 2024-01-15 DIAGNOSIS — H18592 Other hereditary corneal dystrophies, left eye: Secondary | ICD-10-CM | POA: Diagnosis not present

## 2024-01-15 DIAGNOSIS — H16223 Keratoconjunctivitis sicca, not specified as Sjogren's, bilateral: Secondary | ICD-10-CM | POA: Diagnosis not present

## 2024-01-15 DIAGNOSIS — Z961 Presence of intraocular lens: Secondary | ICD-10-CM | POA: Diagnosis not present

## 2024-02-18 DIAGNOSIS — L910 Hypertrophic scar: Secondary | ICD-10-CM | POA: Diagnosis not present

## 2024-02-18 DIAGNOSIS — L281 Prurigo nodularis: Secondary | ICD-10-CM | POA: Diagnosis not present

## 2024-02-18 DIAGNOSIS — D225 Melanocytic nevi of trunk: Secondary | ICD-10-CM | POA: Diagnosis not present

## 2024-02-25 DIAGNOSIS — I10 Essential (primary) hypertension: Secondary | ICD-10-CM | POA: Diagnosis not present

## 2024-02-25 DIAGNOSIS — E785 Hyperlipidemia, unspecified: Secondary | ICD-10-CM | POA: Diagnosis not present

## 2024-02-25 DIAGNOSIS — M8580 Other specified disorders of bone density and structure, unspecified site: Secondary | ICD-10-CM | POA: Diagnosis not present

## 2024-02-25 DIAGNOSIS — E039 Hypothyroidism, unspecified: Secondary | ICD-10-CM | POA: Diagnosis not present

## 2024-02-25 DIAGNOSIS — E538 Deficiency of other specified B group vitamins: Secondary | ICD-10-CM | POA: Diagnosis not present

## 2024-02-25 DIAGNOSIS — Z1212 Encounter for screening for malignant neoplasm of rectum: Secondary | ICD-10-CM | POA: Diagnosis not present

## 2024-02-27 DIAGNOSIS — R82998 Other abnormal findings in urine: Secondary | ICD-10-CM | POA: Diagnosis not present

## 2024-02-27 DIAGNOSIS — M8580 Other specified disorders of bone density and structure, unspecified site: Secondary | ICD-10-CM | POA: Diagnosis not present

## 2024-02-27 DIAGNOSIS — E785 Hyperlipidemia, unspecified: Secondary | ICD-10-CM | POA: Diagnosis not present

## 2024-02-27 DIAGNOSIS — Z1212 Encounter for screening for malignant neoplasm of rectum: Secondary | ICD-10-CM | POA: Diagnosis not present

## 2024-02-27 DIAGNOSIS — E538 Deficiency of other specified B group vitamins: Secondary | ICD-10-CM | POA: Diagnosis not present

## 2024-03-02 ENCOUNTER — Other Ambulatory Visit: Payer: Self-pay | Admitting: Internal Medicine

## 2024-03-02 DIAGNOSIS — Z1231 Encounter for screening mammogram for malignant neoplasm of breast: Secondary | ICD-10-CM

## 2024-03-03 DIAGNOSIS — Z1331 Encounter for screening for depression: Secondary | ICD-10-CM | POA: Diagnosis not present

## 2024-03-03 DIAGNOSIS — Z0000 Encounter for general adult medical examination without abnormal findings: Secondary | ICD-10-CM | POA: Diagnosis not present

## 2024-03-03 DIAGNOSIS — E785 Hyperlipidemia, unspecified: Secondary | ICD-10-CM | POA: Diagnosis not present

## 2024-03-03 DIAGNOSIS — K219 Gastro-esophageal reflux disease without esophagitis: Secondary | ICD-10-CM | POA: Diagnosis not present

## 2024-03-03 DIAGNOSIS — M8580 Other specified disorders of bone density and structure, unspecified site: Secondary | ICD-10-CM | POA: Diagnosis not present

## 2024-03-03 DIAGNOSIS — I7300 Raynaud's syndrome without gangrene: Secondary | ICD-10-CM | POA: Diagnosis not present

## 2024-03-03 DIAGNOSIS — I10 Essential (primary) hypertension: Secondary | ICD-10-CM | POA: Diagnosis not present

## 2024-03-03 DIAGNOSIS — G43109 Migraine with aura, not intractable, without status migrainosus: Secondary | ICD-10-CM | POA: Diagnosis not present

## 2024-03-03 DIAGNOSIS — F325 Major depressive disorder, single episode, in full remission: Secondary | ICD-10-CM | POA: Diagnosis not present

## 2024-03-03 DIAGNOSIS — M1990 Unspecified osteoarthritis, unspecified site: Secondary | ICD-10-CM | POA: Diagnosis not present

## 2024-03-03 DIAGNOSIS — M797 Fibromyalgia: Secondary | ICD-10-CM | POA: Diagnosis not present

## 2024-03-03 DIAGNOSIS — K649 Unspecified hemorrhoids: Secondary | ICD-10-CM | POA: Diagnosis not present

## 2024-03-03 DIAGNOSIS — E663 Overweight: Secondary | ICD-10-CM | POA: Diagnosis not present

## 2024-03-03 DIAGNOSIS — Z1339 Encounter for screening examination for other mental health and behavioral disorders: Secondary | ICD-10-CM | POA: Diagnosis not present

## 2024-03-03 DIAGNOSIS — E039 Hypothyroidism, unspecified: Secondary | ICD-10-CM | POA: Diagnosis not present

## 2024-04-02 ENCOUNTER — Ambulatory Visit
Admission: RE | Admit: 2024-04-02 | Discharge: 2024-04-02 | Disposition: A | Discharge status: Home / Self Care | Source: Ambulatory Visit | Attending: Internal Medicine | Admitting: Internal Medicine

## 2024-04-02 DIAGNOSIS — Z1231 Encounter for screening mammogram for malignant neoplasm of breast: Secondary | ICD-10-CM | POA: Diagnosis not present
